# Patient Record
Sex: Female | Born: 1970 | Race: White | Hispanic: No | Smoking: Former smoker
Health system: Southern US, Community
[De-identification: ages and names within clinical notes are randomized; demographics above are authoritative.]

## PROBLEM LIST (undated history)

## (undated) DIAGNOSIS — F419 Anxiety disorder, unspecified: Secondary | ICD-10-CM

## (undated) DIAGNOSIS — E079 Disorder of thyroid, unspecified: Secondary | ICD-10-CM

## (undated) DIAGNOSIS — C19 Malignant neoplasm of rectosigmoid junction: Secondary | ICD-10-CM

## (undated) DIAGNOSIS — F32A Depression, unspecified: Secondary | ICD-10-CM

## (undated) DIAGNOSIS — D649 Anemia, unspecified: Secondary | ICD-10-CM

## (undated) DIAGNOSIS — T7840XA Allergy, unspecified, initial encounter: Secondary | ICD-10-CM

## (undated) DIAGNOSIS — K589 Irritable bowel syndrome without diarrhea: Secondary | ICD-10-CM

## (undated) DIAGNOSIS — M1990 Unspecified osteoarthritis, unspecified site: Secondary | ICD-10-CM

## (undated) DIAGNOSIS — M810 Age-related osteoporosis without current pathological fracture: Secondary | ICD-10-CM

## (undated) HISTORY — DX: Unspecified osteoarthritis, unspecified site: M19.90

## (undated) HISTORY — DX: Anxiety disorder, unspecified: F41.9

## (undated) HISTORY — DX: Depression, unspecified: F32.A

## (undated) HISTORY — DX: Allergy, unspecified, initial encounter: T78.40XA

## (undated) HISTORY — PX: CHOLECYSTECTOMY: SHX55

## (undated) HISTORY — PX: UPPER GASTROINTESTINAL ENDOSCOPY: SHX188

## (undated) HISTORY — PX: BOWEL RESECTION: SHX1257

## (undated) HISTORY — DX: Anemia, unspecified: D64.9

## (undated) HISTORY — PX: APPENDECTOMY: SHX54

## (undated) HISTORY — PX: ABDOMINAL HYSTERECTOMY: SHX81

## (undated) HISTORY — DX: Irritable bowel syndrome, unspecified: K58.9

## (undated) HISTORY — PX: CATARACT EXTRACTION: SUR2

## (undated) HISTORY — PX: COLON SURGERY: SHX602

## (undated) HISTORY — DX: Disorder of thyroid, unspecified: E07.9

## (undated) HISTORY — DX: Age-related osteoporosis without current pathological fracture: M81.0

---

## 2003-01-19 DIAGNOSIS — C19 Malignant neoplasm of rectosigmoid junction: Secondary | ICD-10-CM

## 2003-01-19 HISTORY — DX: Malignant neoplasm of rectosigmoid junction: C19

## 2011-05-10 ENCOUNTER — Emergency Department (INDEPENDENT_AMBULATORY_CARE_PROVIDER_SITE_OTHER): Payer: BC Managed Care – PPO

## 2011-05-10 ENCOUNTER — Emergency Department (HOSPITAL_BASED_OUTPATIENT_CLINIC_OR_DEPARTMENT_OTHER)
Admission: EM | Admit: 2011-05-10 | Discharge: 2011-05-10 | Disposition: A | Discharge status: Home / Self Care | Payer: BC Managed Care – PPO | Attending: Emergency Medicine | Admitting: Emergency Medicine

## 2011-05-10 ENCOUNTER — Encounter (HOSPITAL_BASED_OUTPATIENT_CLINIC_OR_DEPARTMENT_OTHER): Payer: Self-pay

## 2011-05-10 DIAGNOSIS — S8990XA Unspecified injury of unspecified lower leg, initial encounter: Secondary | ICD-10-CM

## 2011-05-10 DIAGNOSIS — IMO0002 Reserved for concepts with insufficient information to code with codable children: Secondary | ICD-10-CM | POA: Insufficient documentation

## 2011-05-10 DIAGNOSIS — M79609 Pain in unspecified limb: Secondary | ICD-10-CM

## 2011-05-10 DIAGNOSIS — X58XXXA Exposure to other specified factors, initial encounter: Secondary | ICD-10-CM

## 2011-05-10 DIAGNOSIS — S90129A Contusion of unspecified lesser toe(s) without damage to nail, initial encounter: Secondary | ICD-10-CM

## 2011-05-10 DIAGNOSIS — S9780XA Crushing injury of unspecified foot, initial encounter: Secondary | ICD-10-CM | POA: Insufficient documentation

## 2011-05-10 DIAGNOSIS — Y9229 Other specified public building as the place of occurrence of the external cause: Secondary | ICD-10-CM | POA: Insufficient documentation

## 2011-05-10 HISTORY — DX: Malignant neoplasm of rectosigmoid junction: C19

## 2011-05-10 MED ORDER — IBUPROFEN 800 MG PO TABS: 800.0000 mg | ORAL_TABLET | Freq: Three times a day (TID) | ORAL | Status: AC

## 2011-05-10 NOTE — ED Provider Notes (Signed)
Medical screening examination/treatment/procedure(s) were performed by non-physician practitioner and as supervising physician I was immediately available for consultation/collaboration.   Joya Gaskins, MD 05/10/11 1539

## 2011-05-10 NOTE — Discharge Instructions (Signed)
Return here as needed. Ice and elevate your foot and toes.

## 2011-05-10 NOTE — ED Provider Notes (Signed)
History     CSN: [id number]  Arrival date & time 05/10/11  1237   First MD Initiated Contact with Patient 05/10/11 1252      Chief Complaint  Patient presents with  . Foot Injury   HPI Patient presents emergency Dept. one week history of foot pain.  States that she was at a bar with a friend and her friend stepped on her toes with large shoes on.  States, that she has had pain since that time, and her third, fourth, and fifth toes.  Patient states that she is to use ice along with Motrin for pain.  States that the Motrin to help somewhat, but is still discomfort in her toes patient states that movement of her toes and palpation make the pain worse.  She states that she has not had any numbness or weakness of the foot or toes.    Past Medical History  Diagnosis Date  . Colorectal cancer 2005    Past Surgical History  Procedure Date  . Bowel resection   . Appendectomy   . Cholecystectomy   . Abdominal hysterectomy     History reviewed. No pertinent family history.  History  Substance Use Topics  . Smoking status: Current Everyday Smoker    Types: Cigarettes  . Smokeless tobacco: Never Used  . Alcohol Use: Yes     socially    OB History    Grav Para Term Preterm Abortions TAB SAB Ect Mult Living                  Review of Systems All pertinent positives and negatives reviewed in the history of present illness  Allergies  Codeine  Home Medications   Current Outpatient Rx  Name Route Sig Dispense Refill  . IBUPROFEN 800 MG PO TABS Oral Take 800 mg by mouth every 8 (eight) hours as needed.    . NYQUIL PO Oral Take by mouth at bedtime as needed.      BP 153/71  Pulse 91  Temp(Src) 98.5 F (36.9 C) (Oral)  Resp 16  Ht 5\' 9"  (1.753 m)  Wt 180 lb (81.647 kg)  BMI 26.58 kg/m2  SpO2 99%  Physical Exam Physical Examination: General appearance - alert, well appearing, and in no distress and oriented to person, place, and time Chest - clear to auscultation,  no wheezes, rales or rhonchi, symmetric air entry Heart - normal rate, regular rhythm, normal S1, S2, no murmurs, rubs, clicks or gallops Musculoskeletal - abnormal exam of right 3rd,4th and 5th toes, abnormal active range of motion ofR 3rd,4th and 5th toes.Patient has swelling to her 3rd 4th and 5th toes on the R foot and there is some mild bruising noted as well.   ED Course  Procedures (including critical care time)  Dg Foot Complete Right  05/10/2011  *RADIOLOGY REPORT*  Clinical Data: Dorsal foot pain after blunt trauma.  RIGHT FOOT COMPLETE - 3+ VIEW  Comparison: None.  Findings: No fracture, dislocation, or other significant abnormality.  IMPRESSION: Normal exam.  Original Report Authenticated By: Gwynn Burly, M.D.    Placed on a post op shoe and advised to elevate the foot. She is told to return here as needed. The x-rays were reviewed and there is no fracture.She is told to use ice as well.   MDM          Carlyle Dolly, PA-C 05/10/11 [phone]

## 2011-05-10 NOTE — ED Notes (Signed)
Pt states that her friend stepped on her R foot, pt states that she thinks she has 3 broken toes.  This happened on 4/13, pain continues.

## 2013-05-17 HISTORY — PX: ESOPHAGOGASTRODUODENOSCOPY: SHX1529

## 2013-06-20 DIAGNOSIS — M159 Polyosteoarthritis, unspecified: Secondary | ICD-10-CM | POA: Insufficient documentation

## 2013-06-20 DIAGNOSIS — F5101 Primary insomnia: Secondary | ICD-10-CM | POA: Insufficient documentation

## 2013-06-20 DIAGNOSIS — K219 Gastro-esophageal reflux disease without esophagitis: Secondary | ICD-10-CM | POA: Insufficient documentation

## 2013-06-20 DIAGNOSIS — M51369 Other intervertebral disc degeneration, lumbar region without mention of lumbar back pain or lower extremity pain: Secondary | ICD-10-CM | POA: Insufficient documentation

## 2013-06-20 DIAGNOSIS — H9190 Unspecified hearing loss, unspecified ear: Secondary | ICD-10-CM | POA: Insufficient documentation

## 2013-06-22 DIAGNOSIS — C189 Malignant neoplasm of colon, unspecified: Secondary | ICD-10-CM | POA: Insufficient documentation

## 2013-08-19 ENCOUNTER — Encounter (HOSPITAL_BASED_OUTPATIENT_CLINIC_OR_DEPARTMENT_OTHER): Payer: Self-pay | Admitting: Emergency Medicine

## 2013-08-19 ENCOUNTER — Emergency Department (HOSPITAL_BASED_OUTPATIENT_CLINIC_OR_DEPARTMENT_OTHER)
Admission: EM | Admit: 2013-08-19 | Discharge: 2013-08-19 | Disposition: A | Discharge status: Home / Self Care | Payer: BC Managed Care – PPO | Attending: Emergency Medicine | Admitting: Emergency Medicine

## 2013-08-19 ENCOUNTER — Emergency Department (HOSPITAL_BASED_OUTPATIENT_CLINIC_OR_DEPARTMENT_OTHER): Payer: BC Managed Care – PPO

## 2013-08-19 DIAGNOSIS — S99919A Unspecified injury of unspecified ankle, initial encounter: Secondary | ICD-10-CM

## 2013-08-19 DIAGNOSIS — Y9301 Activity, walking, marching and hiking: Secondary | ICD-10-CM | POA: Insufficient documentation

## 2013-08-19 DIAGNOSIS — S93409A Sprain of unspecified ligament of unspecified ankle, initial encounter: Secondary | ICD-10-CM | POA: Insufficient documentation

## 2013-08-19 DIAGNOSIS — Z85038 Personal history of other malignant neoplasm of large intestine: Secondary | ICD-10-CM | POA: Insufficient documentation

## 2013-08-19 DIAGNOSIS — Y929 Unspecified place or not applicable: Secondary | ICD-10-CM | POA: Insufficient documentation

## 2013-08-19 DIAGNOSIS — S93401A Sprain of unspecified ligament of right ankle, initial encounter: Secondary | ICD-10-CM

## 2013-08-19 DIAGNOSIS — S99929A Unspecified injury of unspecified foot, initial encounter: Secondary | ICD-10-CM

## 2013-08-19 DIAGNOSIS — Z87891 Personal history of nicotine dependence: Secondary | ICD-10-CM | POA: Insufficient documentation

## 2013-08-19 DIAGNOSIS — Z79899 Other long term (current) drug therapy: Secondary | ICD-10-CM | POA: Insufficient documentation

## 2013-08-19 DIAGNOSIS — S8990XA Unspecified injury of unspecified lower leg, initial encounter: Secondary | ICD-10-CM | POA: Insufficient documentation

## 2013-08-19 DIAGNOSIS — X500XXA Overexertion from strenuous movement or load, initial encounter: Secondary | ICD-10-CM | POA: Insufficient documentation

## 2013-08-19 MED ORDER — ACETAMINOPHEN 325 MG PO TABS
650.0000 mg | ORAL_TABLET | Freq: Once | ORAL | Status: AC
  Administered 2013-08-19: 650 mg via ORAL
  Filled 2013-08-19: qty 2

## 2013-08-19 NOTE — Discharge Instructions (Signed)
Ankle Sprain °An ankle sprain is an injury to the strong, fibrous tissues (ligaments) that hold the bones of your ankle joint together.  °CAUSES °An ankle sprain is usually caused by a fall or by twisting your ankle. Ankle sprains most commonly occur when you step on the outer edge of your foot, and your ankle turns inward. People who participate in sports are more prone to these types of injuries.  °SYMPTOMS  °· Pain in your ankle. The pain may be present at rest or only when you are trying to stand or walk. °· Swelling. °· Bruising. Bruising may develop immediately or within 1 to 2 days after your injury. °· Difficulty standing or walking, particularly when turning corners or changing directions. °DIAGNOSIS  °Your caregiver will ask you details about your injury and perform a physical exam of your ankle to determine if you have an ankle sprain. During the physical exam, your caregiver will press on and apply pressure to specific areas of your foot and ankle. Your caregiver will try to move your ankle in certain ways. An X-ray exam may be done to be sure a bone was not broken or a ligament did not separate from one of the bones in your ankle (avulsion fracture).  °TREATMENT  °Certain types of braces can help stabilize your ankle. Your caregiver can make a recommendation for this. Your caregiver may recommend the use of medicine for pain. If your sprain is severe, your caregiver may refer you to a surgeon who helps to restore function to parts of your skeletal system (orthopedist) or a physical therapist. °HOME CARE INSTRUCTIONS  °· Apply ice to your injury for 1-2 days or as directed by your caregiver. Applying ice helps to reduce inflammation and pain. °¨ Put ice in a plastic bag. °¨ Place a towel between your skin and the bag. °¨ Leave the ice on for 15-20 minutes at a time, every 2 hours while you are awake. °· Only take over-the-counter or prescription medicines for pain, discomfort, or fever as directed by  your caregiver. °· Elevate your injured ankle above the level of your heart as much as possible for 2-3 days. °· If your caregiver recommends crutches, use them as instructed. Gradually put weight on the affected ankle. Continue to use crutches or a cane until you can walk without feeling pain in your ankle. °· If you have a plaster splint, wear the splint as directed by your caregiver. Do not rest it on anything harder than a pillow for the first 24 hours. Do not put weight on it. Do not get it wet. You may take it off to take a shower or bath. °· You may have been given an elastic bandage to wear around your ankle to provide support. If the elastic bandage is too tight (you have numbness or tingling in your foot or your foot becomes cold and blue), adjust the bandage to make it comfortable. °· If you have an air splint, you may blow more air into it or let air out to make it more comfortable. You may take your splint off at night and before taking a shower or bath. Wiggle your toes in the splint several times per day to decrease swelling. °SEEK MEDICAL CARE IF:  °· You have rapidly increasing bruising or swelling. °· Your toes feel extremely cold or you lose feeling in your foot. °· Your pain is not relieved with medicine. °SEEK IMMEDIATE MEDICAL CARE IF: °· Your toes are numb or blue. °·   You have severe pain that is increasing. °MAKE SURE YOU:  °· Understand these instructions. °· Will watch your condition. °· Will get help right away if you are not doing well or get worse. °Document Released: 01/04/2005 Document Revised: 09/29/2011 Document Reviewed: 01/16/2011 °[employer]® Patient Information ©2015 [employer]. This information is not intended to replace advice given to you by your health care provider. Make sure you discuss any questions you have with your health care provider. ° ° °Ankle Exercises for Rehabilitation °Following ankle injuries, it is as important to follow your caregiver's instructions for  regaining full use of your ankle as it was to follow the initial treatment plan following the injury. The following are some suggestions for exercises and treatment, which can be done to help you regain full use of your ankle as soon as possible. °· Follow all instructions regarding physical therapy. °· Before exercising, it may be helpful to use heat on the muscles or joint being exercised. This loosens up the muscles and tendons (cordlike structure) and decreases chances of injury during your exercises. If this is not possible, just begin your exercises slowly to gradually warm up. °· Stand on your toes several times per day to strengthen the calf muscles. These are the muscles in the back of your leg between the knee and the heel. The cord you can feel just above the heel is the Achilles tendon. Rise up on your toes several times repeating this three to four times per day. Do not exercise to the point of pain. If pain starts to develop, decrease the exercise until you are comfortable again. °· Do range of motion exercises. This means moving the ankle in all directions. Practice writing the alphabet with your toes in the air. Do not increase beyond a range that is comfortable. °· Increase the strength of the muscles in the front of your leg by raising your toes and foot straight up in the air. Repeat this exercise as you did the calf exercise with the same warnings. This also help to stretch your muscles. °· Stretch your calf muscles also by leaning against a wall with your hands in front of you. Put your feet a few feet from the wall and bend your knees until you feel the muscles in your calves become tight. °· After exercising it may be helpful to put ice on the ankle to prevent swelling and improve rehabilitation. This may be done for 15 to 20 minutes following your exercises. If exercising is being done in the workplace, this may not always be possible. °· Taping an ankle injury may be helpful to give added  support following an injury. It also may help prevent reinjury. This may be true if you are in training or in a conditioning program. You and your caregiver can decide on the best course of action to follow. °Document Released: 01/02/2000 Document Revised: 05/21/2013 Document Reviewed: 12/30/2007 °[employer]Care® Patient Information ©2015 [employer]. This information is not intended to replace advice given to you by your health care provider. Make sure you discuss any questions you have with your health care provider. ° ° °

## 2013-08-19 NOTE — ED Notes (Signed)
Patient here with ongoing right ankle pain and swelling after twisting same while walking Friday night. Pain with any ambulation, positive distal pulses, mild swelling noted

## 2013-08-19 NOTE — ED Provider Notes (Signed)
CSN: [id number]     Arrival date & time 08/19/13  1139 History   First MD Initiated Contact with Patient 08/19/13 1413     Chief Complaint  Patient presents with  . Ankle Injury     (Consider location/radiation/quality/duration/timing/severity/associated sxs/prior Treatment) Patient is a 43 y.o. female presenting with ankle pain. The history is provided by the patient.  Ankle Pain Location:  Ankle Time since incident:  2 days Injury: yes   Mechanism of injury: fall   Fall:    Fall occurred: from standing onto the grass.   Height of fall:  Standing   Impact surface:  Grass   Point of impact:  Unable to specify   Entrapped after fall: no   Ankle location:  R ankle Pain details:    Quality:  Aching   Radiates to:  Does not radiate   Severity:  Mild   Onset quality:  Sudden   Duration:  2 days   Timing:  Constant   Progression:  Unchanged Chronicity:  New Dislocation: no   Foreign body present:  No foreign bodies Prior injury to area:  No Relieved by:  Nothing Worsened by:  Bearing weight Ineffective treatments:  None tried Associated symptoms: no back pain, no fatigue, no fever and no neck pain     Past Medical History  Diagnosis Date  . Colorectal cancer 2005   Past Surgical History  Procedure Laterality Date  . Bowel resection    . Appendectomy    . Cholecystectomy    . Abdominal hysterectomy     No family history on file. History  Substance Use Topics  . Smoking status: Former Smoker    Types: Cigarettes  . Smokeless tobacco: Never Used  . Alcohol Use: Yes     Comment: socially   OB History   Grav Para Term Preterm Abortions TAB SAB Ect Mult Living                 Review of Systems  Constitutional: Negative for fever and fatigue.  HENT: Negative for congestion and drooling.   Eyes: Negative for pain.  Respiratory: Negative for cough and shortness of breath.   Cardiovascular: Negative for chest pain.  Gastrointestinal: Negative for nausea,  vomiting, abdominal pain and diarrhea.  Genitourinary: Negative for dysuria and hematuria.  Musculoskeletal: Negative for back pain, gait problem and neck pain.  Skin: Negative for color change.  Neurological: Negative for dizziness and headaches.  Hematological: Negative for adenopathy.  Psychiatric/Behavioral: Negative for behavioral problems.  All other systems reviewed and are negative.     Allergies  Codeine  Home Medications   Prior to Admission medications   Medication Sig Start Date End Date Taking? Authorizing Provider  levothyroxine (SYNTHROID, LEVOTHROID) 112 MCG tablet Take 112 mcg by mouth daily before breakfast.   Yes Historical Provider, MD  LORazepam (ATIVAN) 1 MG tablet Take 1 mg by mouth every 8 (eight) hours.   Yes Historical Provider, MD  ibuprofen (ADVIL,MOTRIN) 800 MG tablet Take 800 mg by mouth every 8 (eight) hours as needed.    Historical Provider, MD  Pseudoeph-Doxylamine-DM-APAP (NYQUIL PO) Take by mouth at bedtime as needed.    Historical Provider, MD   BP 111/80  Pulse 77  Temp(Src) 98.6 F (37 C) (Oral)  Resp 16  Ht 5\' 8"  (1.727 m)  Wt 185 lb (83.915 kg)  BMI 28.14 kg/m2  SpO2 99% Physical Exam  Nursing note and vitals reviewed. Constitutional: She is oriented to person, place, and  time. She appears well-developed and well-nourished.  HENT:  Head: Normocephalic and atraumatic.  Mouth/Throat: Oropharynx is clear and moist. No oropharyngeal exudate.  Eyes: Conjunctivae and EOM are normal. Pupils are equal, round, and reactive to light.  Neck: Normal range of motion. Neck supple.  Cardiovascular: Normal rate, regular rhythm, normal heart sounds and intact distal pulses.  Exam reveals no gallop and no friction rub.   No murmur heard. Pulmonary/Chest: Effort normal and breath sounds normal. No respiratory distress. She has no wheezes.  Abdominal: Soft. Bowel sounds are normal. There is no tenderness. There is no rebound and no guarding.   Musculoskeletal: Normal range of motion. She exhibits no edema and no tenderness.  2+ distal pulses in LE's.   Sensation intact in RLE.   Mildly limited rom of the right ankle d/t pain.   Focal ttp of dorsum of right foot. No gross abnormality noted.   Neurological: She is alert and oriented to person, place, and time.  Skin: Skin is warm and dry.  Psychiatric: She has a normal mood and affect. Her behavior is normal.    ED Course  Procedures (including critical care time) Labs Review Labs Reviewed - No data to display  Imaging Review Dg Ankle Complete Right  08/19/2013   CLINICAL DATA:  Injury  EXAM: RIGHT ANKLE - COMPLETE 3+ VIEW  COMPARISON:  None.  FINDINGS: There is no evidence of fracture, dislocation, or joint effusion. There is no evidence of arthropathy or other focal bone abnormality. Soft tissues are unremarkable.  IMPRESSION: Negative.   Electronically Signed   By: Maryclare Bean M.D.   On: 08/19/2013 13:07     EKG Interpretation None      MDM   Final diagnoses:  Ankle sprain, right, initial encounter    2:16 PM 43 y.o. female pw mechanical fall which occurred 2 days ago when running off her porch. She c/o right foot/ankle pain. I interpreted/reviewed the labs and/or imaging which were non-contributory.  Likely sprain. Rec RICE. Pt would like fx boot for stability. Will provide crutches as well.    3:47 PM:  I have discussed the diagnosis/risks/treatment options with the patient and believe the pt to be eligible for discharge home to follow-up with sports med if no better in 5-6 days. We also discussed returning to the ED immediately if new or worsening sx occur. We discussed the sx which are most concerning (e.g., worsening pain) that necessitate immediate return. Medications administered to the patient during their visit and any new prescriptions provided to the patient are listed below.  Medications given during this visit Medications  acetaminophen (TYLENOL) tablet  650 mg (650 mg Oral Given 08/19/13 1501)    New Prescriptions   No medications on file     Blanchard Kelch, MD 08/20/13 [phone]

## 2013-09-05 ENCOUNTER — Encounter: Payer: Self-pay | Admitting: Family Medicine

## 2013-09-05 ENCOUNTER — Ambulatory Visit (INDEPENDENT_AMBULATORY_CARE_PROVIDER_SITE_OTHER): Payer: BC Managed Care – PPO | Admitting: Family Medicine

## 2013-09-05 VITALS — BP 127/84 | HR 84 | Ht 69.0 in | Wt 204.0 lb

## 2013-09-05 DIAGNOSIS — S99929A Unspecified injury of unspecified foot, initial encounter: Secondary | ICD-10-CM

## 2013-09-05 DIAGNOSIS — S8990XA Unspecified injury of unspecified lower leg, initial encounter: Secondary | ICD-10-CM

## 2013-09-05 DIAGNOSIS — S99919A Unspecified injury of unspecified ankle, initial encounter: Secondary | ICD-10-CM

## 2013-09-05 DIAGNOSIS — S99911A Unspecified injury of right ankle, initial encounter: Secondary | ICD-10-CM

## 2013-09-05 MED ORDER — HYDROCODONE-ACETAMINOPHEN 5-325 MG PO TABS: 1.0000 | ORAL_TABLET | Freq: Four times a day (QID) | ORAL | Status: DC | PRN

## 2013-09-05 NOTE — Patient Instructions (Signed)
You have a high ankle sprain. These typically take 6-8 weeks to recover. Ice the area for 15 minutes at a time, 3-4 times a day Aleve 2 tabs twice a day with food OR ibuprofen 3 tabs three times a day with food for pain and inflammation. Norco as needed for severe pain. Elevate above the level of your heart when possible Crutches if needed to help with walking Bear weight when tolerated Use laceup ankle brace to help with stability while you recover from this injury. Ok to use the boot if necessary for long walks. Come out of the boot/brace twice a day to do Up/down and alphabet exercises 2-3 sets of each. Consider physical therapy for strengthening and balance exercises in the future. If not improving as expected, we may repeat x-rays or consider further testing like an MRI. Follow up with me in 2 weeks - hopefully we can add therapy and/or strengthening exercises at that time.

## 2013-09-06 ENCOUNTER — Encounter: Payer: Self-pay | Admitting: Family Medicine

## 2013-09-07 ENCOUNTER — Encounter: Payer: Self-pay | Admitting: Family Medicine

## 2013-09-07 DIAGNOSIS — S99911A Unspecified injury of right ankle, initial encounter: Secondary | ICD-10-CM | POA: Insufficient documentation

## 2013-09-07 NOTE — Assessment & Plan Note (Signed)
Right ankle sprain - consistent with high ankle sprain.  Radiographs and today's brief MSK u/s negative for fracture of malleoli or 5th metatarsal.  Icing, nsaids with norco as needed.  Switch to ASO if tolerated and do HEP that was reviewed today.  Consider PT in future.  F/u in 2 weeks for reevaluation.

## 2013-09-07 NOTE — Progress Notes (Signed)
Patient ID: Michelle Potts, female   DOB: Jun 02, 1970, 43 y.o.   MRN: [id number]  PCP: Patricia Nettle, MD  Subjective:   HPI: Patient is a 43 y.o. female here for right ankle injury.  Patient reports she was going down steps on July 31 when she stepped into a hole and rolled the ankle. Inversion type of injury. Couldn't bear weight right after this. Still with 7/10 level of pain. Difficulty sleeping. Most pain anterolateral ankle. Taking advil. Using cam walker as well. Radiographs negative for fracture.  Past Medical History  Diagnosis Date  . Colorectal cancer 2005    Current Outpatient Prescriptions on File Prior to Visit  Medication Sig Dispense Refill  . levothyroxine (SYNTHROID, LEVOTHROID) 112 MCG tablet Take 112 mcg by mouth daily before breakfast.      . LORazepam (ATIVAN) 1 MG tablet Take 1 mg by mouth every 8 (eight) hours.       No current facility-administered medications on file prior to visit.    Past Surgical History  Procedure Laterality Date  . Bowel resection    . Appendectomy    . Cholecystectomy    . Abdominal hysterectomy      Allergies  Allergen Reactions  . Codeine Nausea And Vomiting    History   Social History  . Marital Status: Married    Spouse Name: N/A    Number of Children: N/A  . Years of Education: N/A   Occupational History  . Not on file.   Social History Main Topics  . Smoking status: Former Smoker    Types: Cigarettes  . Smokeless tobacco: Never Used  . Alcohol Use: Yes     Comment: socially  . Drug Use: No  . Sexual Activity: Not on file   Other Topics Concern  . Not on file   Social History Narrative  . No narrative on file    No family history on file.  BP 127/84  Pulse 84  Ht 5\' 9"  (1.753 m)  Wt 204 lb (92.534 kg)  BMI 30.11 kg/m2  Review of Systems: See HPI above.    Objective:  Physical Exam:  Gen: NAD  Right ankle/foot: Mild- mod swelling antero lateral ankle.  No bruising, other  deformity. Mild limitation ROM all directions. TTP anterior ankle joint, less over ATFL.  Mild tenderrness both malleoli, minimal base 5th. No other foot/ankle tenderness. Painful ant drawer and talar tilt, guarding. Positive syndesmotic compression. Thompsons test negative. NV intact distally.    Assessment & Plan:  1. Right ankle sprain - consistent with high ankle sprain.  Radiographs and today's brief MSK u/s negative for fracture of malleoli or 5th metatarsal.  Icing, nsaids with norco as needed.  Switch to ASO if tolerated and do HEP that was reviewed today.  Consider PT in future.  F/u in 2 weeks for reevaluation.

## 2013-09-10 ENCOUNTER — Telehealth: Payer: Self-pay | Admitting: Family Medicine

## 2013-09-10 NOTE — Telephone Encounter (Signed)
She could try tramadol instead though it may cause same side effects (it's not as strong as the hydrocodone).  Other option is taking an anti-nausea medicine like phenergan with the hydrocodone.  Let me know if she would like to do either of these.

## 2013-09-11 DIAGNOSIS — E039 Hypothyroidism, unspecified: Secondary | ICD-10-CM | POA: Insufficient documentation

## 2013-09-19 ENCOUNTER — Encounter: Payer: Self-pay | Admitting: Family Medicine

## 2013-09-19 ENCOUNTER — Ambulatory Visit (HOSPITAL_BASED_OUTPATIENT_CLINIC_OR_DEPARTMENT_OTHER)
Admission: RE | Admit: 2013-09-19 | Discharge: 2013-09-19 | Disposition: A | Discharge status: Home / Self Care | Payer: BC Managed Care – PPO | Source: Ambulatory Visit | Attending: Family Medicine | Admitting: Family Medicine

## 2013-09-19 ENCOUNTER — Ambulatory Visit (INDEPENDENT_AMBULATORY_CARE_PROVIDER_SITE_OTHER): Payer: BC Managed Care – PPO | Admitting: Family Medicine

## 2013-09-19 VITALS — BP 127/88 | HR 87 | Ht 69.0 in | Wt 204.0 lb

## 2013-09-19 DIAGNOSIS — S99919A Unspecified injury of unspecified ankle, initial encounter: Secondary | ICD-10-CM

## 2013-09-19 DIAGNOSIS — S8990XA Unspecified injury of unspecified lower leg, initial encounter: Secondary | ICD-10-CM | POA: Diagnosis not present

## 2013-09-19 DIAGNOSIS — S99911D Unspecified injury of right ankle, subsequent encounter: Secondary | ICD-10-CM

## 2013-09-19 DIAGNOSIS — S99929A Unspecified injury of unspecified foot, initial encounter: Secondary | ICD-10-CM

## 2013-09-19 DIAGNOSIS — X58XXXA Exposure to other specified factors, initial encounter: Secondary | ICD-10-CM | POA: Diagnosis not present

## 2013-09-19 DIAGNOSIS — Z5189 Encounter for other specified aftercare: Secondary | ICD-10-CM | POA: Diagnosis present

## 2013-09-19 NOTE — Addendum Note (Signed)
Addended by: Sherrie George F on: 09/19/2013 02:10 PM   Modules accepted: Orders

## 2013-09-19 NOTE — Assessment & Plan Note (Signed)
Repeat radiographs negative today.  Consistent with high ankle sprain. Icing, nsaids with norco as needed (this made her sick and was considering to try tramadol instead).  Over a month out from injury with minimal improvement though no evidence CRPS, tendon rupture.  OCD is possible but unlikely.  Will continue ASO, nsaids.  Add physical therapy.  F/u in 4 weeks.  MRI if not improving.

## 2013-09-19 NOTE — Progress Notes (Signed)
Patient ID: Michelle Potts, female   DOB: 31-Oct-1970, 43 y.o.   MRN: [id number]  PCP: Patricia Nettle, MD  Subjective:   HPI: Patient is a 43 y.o. female here for right ankle injury.  8/19: Patient reports she was going down steps on July 31 when she stepped into a hole and rolled the ankle. Inversion type of injury. Couldn't bear weight right after this. Still with 7/10 level of pain. Difficulty sleeping. Most pain anterolateral ankle. Taking advil. Using cam walker as well. Radiographs negative for fracture.  9/2: Patient reports still having 7/10 level of pain in ankle. Maybe slightly better than last visit. Using one crutch with ASO to help ambulate. Given hydrocodone from ED. Some swelling. Takes advil as well. Pain radiates up lower leg.  Past Medical History  Diagnosis Date  . Colorectal cancer 2005    Current Outpatient Prescriptions on File Prior to Visit  Medication Sig Dispense Refill  . dicyclomine (BENTYL) 20 MG tablet       . levothyroxine (SYNTHROID, LEVOTHROID) 112 MCG tablet Take 112 mcg by mouth daily before breakfast.      . liothyronine (CYTOMEL) 25 MCG tablet       . LORazepam (ATIVAN) 1 MG tablet Take 1 mg by mouth every 8 (eight) hours.       No current facility-administered medications on file prior to visit.    Past Surgical History  Procedure Laterality Date  . Bowel resection    . Appendectomy    . Cholecystectomy    . Abdominal hysterectomy      Allergies  Allergen Reactions  . Codeine Nausea And Vomiting    History   Social History  . Marital Status: Married    Spouse Name: N/A    Number of Children: N/A  . Years of Education: N/A   Occupational History  . Not on file.   Social History Main Topics  . Smoking status: Former Smoker    Types: Cigarettes  . Smokeless tobacco: Never Used  . Alcohol Use: Yes     Comment: socially  . Drug Use: No  . Sexual Activity: Not on file   Other Topics Concern  . Not on file   Social  History Narrative  . No narrative on file    No family history on file.  BP 127/88  Pulse 87  Ht 5\' 9"  (1.753 m)  Wt 204 lb (92.534 kg)  BMI 30.11 kg/m2  Review of Systems: See HPI above.    Objective:  Physical Exam:  Gen: NAD  Right ankle/foot: Minimal swelling antero lateral ankle, improved.  No bruising, other deformity. Mild limitation ROM all directions. TTP anterior ankle joint, less over ATFL.  Mild tenderrness both malleoli, minimal base 5th. No other foot/ankle tenderness. Painful ant drawer and talar tilt, guarding. Positive syndesmotic compression. Thompsons test negative. NV intact distally.    Assessment & Plan:  1. Right ankle injury - Repeat radiographs negative today.  Consistent with high ankle sprain. Icing, nsaids with norco as needed (this made her sick and was considering to try tramadol instead).  Over a month out from injury with minimal improvement though no evidence CRPS, tendon rupture.  OCD is possible but unlikely.  Will continue ASO, nsaids.  Add physical therapy.  F/u in 4 weeks.  MRI if not improving.

## 2013-09-19 NOTE — Patient Instructions (Signed)
You have a high ankle sprain. Start physical therapy for strengthening and balance exercises, modalities. These typically take 6-8 weeks to recover. Ice the area for 15 minutes at a time, 3-4 times a day Aleve 2 tabs twice a day with food OR ibuprofen 3 tabs three times a day with food for pain and inflammation. Tramadol as needed for severe pain. Elevate above the level of your heart when possible Crutches if needed to help with walking Bear weight when tolerated Use laceup ankle brace to help with stability while you recover from this injury. Ok to use the boot if necessary for long walks. Come out of the boot/brace twice a day to do Up/down and alphabet exercises 2-3 sets of each. If not improving as expected, we will consider further testing like an MRI. Follow up with me in 4 weeks. Take vitamin C 500mg  daily.

## 2013-10-24 ENCOUNTER — Ambulatory Visit: Payer: BC Managed Care – PPO | Admitting: Family Medicine

## 2013-11-20 DIAGNOSIS — E782 Mixed hyperlipidemia: Secondary | ICD-10-CM | POA: Insufficient documentation

## 2014-06-24 HISTORY — PX: COLONOSCOPY: SHX174

## 2014-07-03 DIAGNOSIS — L732 Hidradenitis suppurativa: Secondary | ICD-10-CM | POA: Insufficient documentation

## 2015-07-30 DIAGNOSIS — J321 Chronic frontal sinusitis: Secondary | ICD-10-CM | POA: Insufficient documentation

## 2015-07-30 DIAGNOSIS — J301 Allergic rhinitis due to pollen: Secondary | ICD-10-CM | POA: Insufficient documentation

## 2015-07-30 DIAGNOSIS — F4322 Adjustment disorder with anxiety: Secondary | ICD-10-CM | POA: Insufficient documentation

## 2015-11-10 DIAGNOSIS — L659 Nonscarring hair loss, unspecified: Secondary | ICD-10-CM | POA: Insufficient documentation

## 2016-04-14 DIAGNOSIS — F419 Anxiety disorder, unspecified: Secondary | ICD-10-CM | POA: Insufficient documentation

## 2016-08-16 ENCOUNTER — Other Ambulatory Visit: Payer: Self-pay | Admitting: Obstetrics and Gynecology

## 2016-08-16 DIAGNOSIS — N6453 Retraction of nipple: Secondary | ICD-10-CM

## 2016-08-17 ENCOUNTER — Ambulatory Visit
Admission: RE | Admit: 2016-08-17 | Discharge: 2016-08-17 | Disposition: A | Discharge status: Home / Self Care | Payer: 59 | Source: Ambulatory Visit | Attending: Obstetrics and Gynecology | Admitting: Obstetrics and Gynecology

## 2016-08-17 DIAGNOSIS — N6453 Retraction of nipple: Secondary | ICD-10-CM

## 2016-08-17 MED ORDER — GADOBENATE DIMEGLUMINE 529 MG/ML IV SOLN
15.0000 mL | Freq: Once | INTRAVENOUS | Status: AC | PRN
  Administered 2016-08-17: 15 mL via INTRAVENOUS

## 2016-09-09 DIAGNOSIS — E559 Vitamin D deficiency, unspecified: Secondary | ICD-10-CM | POA: Insufficient documentation

## 2017-10-31 DIAGNOSIS — E538 Deficiency of other specified B group vitamins: Secondary | ICD-10-CM | POA: Insufficient documentation

## 2018-08-07 DIAGNOSIS — K582 Mixed irritable bowel syndrome: Secondary | ICD-10-CM | POA: Insufficient documentation

## 2020-03-04 ENCOUNTER — Other Ambulatory Visit: Payer: Self-pay

## 2020-03-04 ENCOUNTER — Encounter: Payer: Self-pay | Admitting: Family Medicine

## 2020-03-04 ENCOUNTER — Ambulatory Visit: Payer: Managed Care, Other (non HMO) | Admitting: Family Medicine

## 2020-03-04 VITALS — BP 140/84 | HR 80 | Temp 98.9°F | Ht 69.0 in | Wt 148.5 lb

## 2020-03-04 DIAGNOSIS — R109 Unspecified abdominal pain: Secondary | ICD-10-CM

## 2020-03-04 DIAGNOSIS — Z85038 Personal history of other malignant neoplasm of large intestine: Secondary | ICD-10-CM | POA: Diagnosis not present

## 2020-03-04 DIAGNOSIS — F418 Other specified anxiety disorders: Secondary | ICD-10-CM

## 2020-03-04 DIAGNOSIS — R0789 Other chest pain: Secondary | ICD-10-CM

## 2020-03-04 MED ORDER — HYDROXYZINE HCL 25 MG PO TABS: 25.0000 mg | ORAL_TABLET | Freq: Three times a day (TID) | ORAL | 2 refills | Status: DC | PRN

## 2020-03-04 MED ORDER — AMITRIPTYLINE HCL 25 MG PO TABS: 25.0000 mg | ORAL_TABLET | Freq: Every day | ORAL | 2 refills | Status: DC

## 2020-03-04 MED ORDER — MELOXICAM 15 MG PO TABS: 15.0000 mg | ORAL_TABLET | Freq: Every day | ORAL | 0 refills | Status: DC

## 2020-03-04 MED ORDER — DICYCLOMINE HCL 10 MG PO CAPS: ORAL_CAPSULE | ORAL | 0 refills | Status: DC

## 2020-03-04 NOTE — Progress Notes (Signed)
Chief Complaint  Patient presents with  . New Patient (Initial Visit)    Rib pain Referral GI Stress Congestion        New Patient Visit SUBJECTIVE: HPI: Michelle Potts is an 50 y.o.female who is being seen for establishing care.  The patient was previously seen at [facility].  Cramping, bowel changes, abd pain. Ove rpast several mo got quite a bit worse. She has been losing weight- 30 lbs over past 1.5 years, 7-10 lbs since her husbands dx of lung cancer stage IV. Feels like gnawing and butterflies at the same time. No tarry stools or bleeding. Had a colonoscopy 06/2018, reported as normal. She was told she was to f/u in 5 years. Stress makes her s/s's worse.   R sided rib pain over past few (she thinks 3) days. She was also cleaning out her closet. She was also dancing. Has been using heat and Tylenol.   Husband dx'd w stage IV lung cancer and she has been having lots of issues sleeping because of racing thoughts and with stress. She is not taking anything routinely. She does not have a counselor/psychologist.   Past Medical History:  Diagnosis Date  . Colorectal cancer ([place]) 2005   Past Surgical History:  Procedure Laterality Date  . ABDOMINAL HYSTERECTOMY    . APPENDECTOMY    . BOWEL RESECTION    . CHOLECYSTECTOMY     History reviewed. No pertinent family history. Allergies  Allergen Reactions  . Codeine Nausea And Vomiting    Current Outpatient Medications:  .  amitriptyline (ELAVIL) 25 MG tablet, Take 1 tablet (25 mg total) by mouth at bedtime., Disp: 30 tablet, Rfl: 2 .  dicyclomine (BENTYL) 10 MG capsule, Take 1 tab every 6 hours as needed for abdominal cramping., Disp: 60 capsule, Rfl: 0 .  hydrOXYzine (ATARAX/VISTARIL) 25 MG tablet, Take 1-3 tablets (25-75 mg total) by mouth every 8 (eight) hours as needed for anxiety., Disp: 60 tablet, Rfl: 2 .  meloxicam (MOBIC) 15 MG tablet, Take 1 tablet (15 mg total) by mouth daily., Disp: 30 tablet, Rfl: 0  OBJECTIVE: BP 140/84 (BP  Location: Right Arm, Patient Position: Sitting, Cuff Size: Normal)   Pulse 80   Temp 98.9 F (37.2 C) (Oral)   Ht 5\' 9"  (1.753 m)   Wt 148 lb 8 oz (67.4 kg)   SpO2 99%   BMI 21.93 kg/m  General:  well developed, well nourished, in no apparent distress Skin:  no significant moles, warts, or growths Abd: BS+, S, ND, diffuse ttp, neg Rovsing's, Carnett's, Murphy's, McBurney's Throat/Pharynx:  lips and gingiva without lesion; tongue and uvula midline; non-inflamed pharynx; no exudates or postnasal drainage Lungs:  clear to auscultation, breath sounds equal bilaterally, no respiratory distress Cardio:  regular rate and rhythm, no LE edema or bruits Musculoskeletal: +ttp over R posterior ax line of chest wall around R 6-8. No crepitus. No erythema or edema noted.  Psych: well oriented with normal range of affect and appropriate judgment/insight  ASSESSMENT/PLAN: History of colon cancer - Plan: Ambulatory referral to Gastroenterology  Abdominal cramping - Plan: amitriptyline (ELAVIL) 25 MG tablet, Ambulatory referral to Gastroenterology, dicyclomine (BENTYL) 10 MG capsule  Right-sided chest wall pain - Plan: meloxicam (MOBIC) 15 MG tablet  Situational anxiety - Plan: amitriptyline (ELAVIL) 25 MG tablet, hydrOXYzine (ATARAX/VISTARIL) 25 MG tablet  1/2. Pt would prefer to transfer care from [facility] to [facility]. Will place referral. I think she has IBS components to her issue today. Will initiate Elavil  25 mg qhs. Bentyl prn.  3. Mobic. Ice. Heat. Stretching. 4. Elavil as above. [facility] info provided. Anxiety coping techniques discussed and written down. Atarax 25-75 mg q 8 hrs prn. States she is stressed and that is why her BP is up today. Patient should return in 1 mo to reck. The patient voiced understanding and agreement to the plan.   [place], DO 03/04/20  9:27 AM

## 2020-03-04 NOTE — Patient Instructions (Signed)
If you do not hear anything about your referral in the next 1-2 weeks, call our office and ask for an update.  Please consider counseling. Contact [phone] to schedule an appointment or inquire about cost/insurance coverage.  Aim to do some physical exertion for 150 minutes per week. This is typically divided into 5 days per week, 30 minutes per day. The activity should be enough to get your heart rate up. Anything is better than nothing if you have time constraints.  Coping skills Choose 5 that work for you:  Take a deep breath  Count to 20  Read a book  Do a puzzle  Meditate  Bake  Sing  Knit  Garden   Pray  Go outside  Call a friend  Listen to music  Take a walk  Color  Send a note   Take a bath  Watch a movie  Be alone in a quiet place  Pet an animal  Visit a friend  Journal  Exercise  Stretch   

## 2020-03-06 ENCOUNTER — Encounter: Payer: Self-pay | Admitting: Gastroenterology

## 2020-03-14 ENCOUNTER — Other Ambulatory Visit: Payer: Self-pay | Admitting: Family Medicine

## 2020-03-14 DIAGNOSIS — R0789 Other chest pain: Secondary | ICD-10-CM

## 2020-03-14 NOTE — Progress Notes (Signed)
refe

## 2020-03-19 ENCOUNTER — Ambulatory Visit (HOSPITAL_BASED_OUTPATIENT_CLINIC_OR_DEPARTMENT_OTHER)
Admission: RE | Admit: 2020-03-19 | Discharge: 2020-03-19 | Disposition: A | Discharge status: Home / Self Care | Payer: Managed Care, Other (non HMO) | Source: Ambulatory Visit | Attending: Family Medicine | Admitting: Family Medicine

## 2020-03-19 ENCOUNTER — Other Ambulatory Visit (INDEPENDENT_AMBULATORY_CARE_PROVIDER_SITE_OTHER): Payer: Managed Care, Other (non HMO)

## 2020-03-19 ENCOUNTER — Ambulatory Visit (INDEPENDENT_AMBULATORY_CARE_PROVIDER_SITE_OTHER): Payer: Managed Care, Other (non HMO) | Admitting: Family Medicine

## 2020-03-19 ENCOUNTER — Encounter: Payer: Self-pay | Admitting: Family Medicine

## 2020-03-19 ENCOUNTER — Encounter: Payer: Self-pay | Admitting: Gastroenterology

## 2020-03-19 ENCOUNTER — Ambulatory Visit: Payer: Managed Care, Other (non HMO) | Admitting: Gastroenterology

## 2020-03-19 ENCOUNTER — Other Ambulatory Visit: Payer: Self-pay

## 2020-03-19 VITALS — BP 114/80 | HR 79 | Temp 97.6°F | Ht 69.0 in | Wt 147.0 lb

## 2020-03-19 VITALS — BP 112/76 | HR 76 | Ht 69.0 in | Wt 174.1 lb

## 2020-03-19 DIAGNOSIS — R1011 Right upper quadrant pain: Secondary | ICD-10-CM

## 2020-03-19 DIAGNOSIS — R0789 Other chest pain: Secondary | ICD-10-CM | POA: Diagnosis not present

## 2020-03-19 DIAGNOSIS — K582 Mixed irritable bowel syndrome: Secondary | ICD-10-CM

## 2020-03-19 DIAGNOSIS — Z85038 Personal history of other malignant neoplasm of large intestine: Secondary | ICD-10-CM | POA: Diagnosis not present

## 2020-03-19 DIAGNOSIS — K2900 Acute gastritis without bleeding: Secondary | ICD-10-CM | POA: Diagnosis not present

## 2020-03-19 DIAGNOSIS — L309 Dermatitis, unspecified: Secondary | ICD-10-CM | POA: Diagnosis not present

## 2020-03-19 DIAGNOSIS — F418 Other specified anxiety disorders: Secondary | ICD-10-CM

## 2020-03-19 LAB — COMPREHENSIVE METABOLIC PANEL
ALT: 8 U/L (ref 0–35)
AST: 10 U/L (ref 0–37)
Albumin: 3.9 g/dL (ref 3.5–5.2)
Alkaline Phosphatase: 61 U/L (ref 39–117)
BUN: 13 mg/dL (ref 6–23)
CO2: 35 mEq/L — ABNORMAL HIGH (ref 19–32)
Calcium: 9.5 mg/dL (ref 8.4–10.5)
Chloride: 98 mEq/L (ref 96–112)
Creatinine, Ser: 0.75 mg/dL (ref 0.40–1.20)
GFR: 92.48 mL/min (ref >60.00)
Glucose, Bld: 78 mg/dL (ref 70–99)
Potassium: 3.9 mEq/L (ref 3.5–5.1)
Sodium: 138 mEq/L (ref 135–145)
Total Bilirubin: 0.2 mg/dL (ref 0.2–1.2)
Total Protein: 7 g/dL (ref 6.0–8.3)

## 2020-03-19 LAB — CBC WITH DIFFERENTIAL/PLATELET
Basophils Absolute: 0 10*3/uL (ref 0.0–0.1)
Basophils Relative: 0.6 % (ref 0.0–3.0)
Eosinophils Absolute: 0.3 10*3/uL (ref 0.0–0.7)
Eosinophils Relative: 5.9 % — ABNORMAL HIGH (ref 0.0–5.0)
HCT: 34.5 % — ABNORMAL LOW (ref 36.0–46.0)
Hemoglobin: 11.5 g/dL — ABNORMAL LOW (ref 12.0–15.0)
Lymphocytes Relative: 32 % (ref 12.0–46.0)
Lymphs Abs: 1.7 10*3/uL (ref 0.7–4.0)
MCHC: 33.2 g/dL (ref 30.0–36.0)
MCV: 91.6 fl (ref 78.0–100.0)
Monocytes Absolute: 0.4 10*3/uL (ref 0.1–1.0)
Monocytes Relative: 6.9 % (ref 3.0–12.0)
Neutro Abs: 2.8 10*3/uL (ref 1.4–7.7)
Neutrophils Relative %: 54.6 % (ref 43.0–77.0)
Platelets: 210 10*3/uL (ref 150.0–400.0)
RBC: 3.77 Mil/uL — ABNORMAL LOW (ref 3.87–5.11)
RDW: 13.3 % (ref 11.5–15.5)
WBC: 5.2 10*3/uL (ref 4.0–10.5)

## 2020-03-19 LAB — HIGH SENSITIVITY CRP: CRP, High Sensitivity: 1.92 mg/L (ref 0.000–5.000)

## 2020-03-19 LAB — LIPASE: Lipase: 33 U/L (ref 11.0–59.0)

## 2020-03-19 MED ORDER — TRIAMCINOLONE ACETONIDE 0.5 % EX OINT: 1.0000 "application " | TOPICAL_OINTMENT | Freq: Two times a day (BID) | CUTANEOUS | 0 refills | Status: DC

## 2020-03-19 MED ORDER — ONDANSETRON 4 MG PO TBDP: 4.0000 mg | ORAL_TABLET | Freq: Three times a day (TID) | ORAL | 0 refills | Status: DC | PRN

## 2020-03-19 MED ORDER — LORAZEPAM 0.5 MG PO TABS: 0.5000 mg | ORAL_TABLET | Freq: Two times a day (BID) | ORAL | 1 refills | Status: DC | PRN

## 2020-03-19 NOTE — Patient Instructions (Signed)
If you are age 51 or older, your body mass index should be between 23-30. Your Body mass index is 25.71 kg/m. If this is out of the aforementioned range listed, please consider follow up with your Primary Care Provider.  If you are age 26 or younger, your body mass index should be between 19-25. Your Body mass index is 25.71 kg/m. If this is out of the aformentioned range listed, please consider follow up with your Primary Care Provider.   Please go to the lab on the 2nd floor [address] before you leave the office today.   You have been [facility] CT [facility] [place] [address] [address] [facility]).   You are scheduled on 03/26/2020 at 1130am . You should arrive 15 minutes prior to your appointment time for registration. Please follow the written instructions below on the day of your exam:  WARNING: IF YOU ARE ALLERGIC TO IODINE/X-RAY DYE, PLEASE NOTIFY RADIOLOGY IMMEDIATELY AT [phone]! YOU WILL BE GIVEN A 13 HOUR PREMEDICATION PREP.  1) Do not eat or drink anything after 730am  (4 hours prior to your test) 2) You have been given 2 bottles of oral contrast to drink. The solution may taste better if refrigerated, but do NOT add ice or any other liquid to this solution. Shake well before drinking.    Drink 1 bottle of contrast @ 930am (2 hours prior to your exam)  Drink 1 bottle of contrast @ 1030am (1 hour prior to your exam)  You may take any medications as prescribed with a small amount of water, if necessary. If you take any of the following medications: METFORMIN, GLUCOPHAGE, GLUCOVANCE, AVANDAMET, RIOMET, FORTAMET, JAARS MET, JANUMET, GLUMETZA or METAGLIP, you MAY be asked to HOLD this medication 48 hours AFTER the exam.  The purpose of you drinking the oral contrast is to aid in the visualization of your intestinal tract. The contrast solution may cause some diarrhea. Depending on your individual set of  symptoms, you may also receive an intravenous injection of x-ray contrast/dye. Plan on being at [facility] for 30 minutes or longer, depending on the type of exam you are having performed.  This test typically takes 30-45 minutes to complete.  If you have any questions regarding your exam or if you need to reschedule, you may call the CT department at [phone] between the hours of 8:00 am and 5:00 pm, Monday-Friday.  ________________________________________________________________________  Please purchase the following medications over the counter and take as directed: Probiotic  Please eat 3 meals a day  Start Elavil as well  Follow up in 12 weeks. Please call us to schedule an office visit at thaht time.   Thank you,  Dr. Jackquline Denmark

## 2020-03-19 NOTE — Progress Notes (Signed)
Chief Complaint: Longstanding abdominal complaints  Referring Provider:  Shelda Pal*      ASSESSMENT AND PLAN;   #1. IBS-alt diarrhea/constipation/abdo pain. Nl TSH.   #2. H/O colon Ca 2005 s/p sigmoid resection. Last colon: neg 06/2018  #3. RUQ pain. Neg EGD prev, s/p lap chole  Plan: -CBC, CMP, CRP, lipase, celiac screen, CEA -CT AP with PO IV contrast. -Probiotics 1/day. -3 meals/day -Start elavil 25mg   qhs -FU in 12 weeks.  -Next routine colonoscopy due 06/2023.   HPI:    Michelle Potts is a 50 y.o. female  With H/O anxiety, under considerable stress-husband has been diagnosed as having stage IV non-small cell lung cancer (undergoing immunotherapy), personal history of colon cancer s/p sigmoid resection.  Last colon[facility] 06/2018: neg with negative random colonic biopsies.  Anastomosis at 30 cm-patent.  Rpt 06/2023  C/O longstanding history of alternating diarrhea and constipation with lower abdominal cramps, upper abdominal discomfort and pain with bloating which gets better with defecation.  She has been diagnosed as having IBS with alternating diarrhea and constipation.  Given trial of Bentyl without any relief.  She does admit that stress makes her symptoms worse.  There is no history suggestive of lactose or gluten intolerance.  No melena or hematochezia.  The only other concern is that she has lost 45 pounds over the last 3 years.  She does admit that she only eats 1 meal a day and has not been eating well.  Denies having any significant upper GI symptoms including nausea, vomiting, heartburn, regurgitation, odynophagia or dysphagia.   Past GI procedures (at [facility]) Colonoscopy 06/2018: neg, neg random colonic biopsies.  Anastomosis at 30 cm.  Next due 06/2023 Colonoscopy 06/2014: neg EGD 04/2013: Normal.  Negative duodenal biopsies for celiac.  Past Medical History:  Diagnosis Date  . Colorectal cancer ([place]) 2005  . IBS  (irritable bowel syndrome)     Past Surgical History:  Procedure Laterality Date  . ABDOMINAL HYSTERECTOMY    . APPENDECTOMY    . BOWEL RESECTION    . CHOLECYSTECTOMY    . COLONOSCOPY  06/24/2014   [facility]  . ESOPHAGOGASTRODUODENOSCOPY  05/17/2013   [facility] Everywhere    Family History  Problem Relation Age of Onset  . Stroke Father     Social History   Tobacco Use  . Smoking status: Former Smoker    Types: Cigarettes  . Smokeless tobacco: Never Used  Vaping Use  . Vaping Use: Never used  Substance Use Topics  . Alcohol use: Yes    Comment: socially  . Drug use: No    Current Outpatient Medications  Medication Sig Dispense Refill  . amitriptyline (ELAVIL) 25 MG tablet Take 1 tablet (25 mg total) by mouth at bedtime. 30 tablet 2  . levothyroxine (SYNTHROID) 88 MCG tablet Take one tab po daily. BRAND NAME ONLY. DAW/1    . SUMAtriptan (IMITREX) 25 MG tablet Take by mouth.    . traZODone (DESYREL) 50 MG tablet Take by mouth.     No current facility-administered medications for this visit.    Allergies  Allergen Reactions  . Codeine Nausea And Vomiting    Review of Systems:  Constitutional: Denies fever, chills, diaphoresis, appetite change and has fatigue.  HEENT: Denies photophobia, eye pain, redness, hearing loss, ear pain, congestion, sore throat, rhinorrhea, sneezing, mouth sores, neck pain, neck stiffness and tinnitus.   Respiratory: Denies SOB, DOE, cough, chest tightness,  and wheezing.   Cardiovascular: Denies chest pain, palpitations and leg swelling.  Genitourinary: Denies dysuria, urgency, frequency, hematuria, flank pain and difficulty urinating.  Musculoskeletal: Denies myalgias, back pain, joint swelling, arthralgias and gait problem.  Skin: No rash.  Neurological: Denies dizziness, seizures, syncope, weakness, light-headedness, numbness and headaches.  Hematological: Denies adenopathy. Easy bruising, personal or  family bleeding history  Psychiatric/Behavioral: Has anxiety or depression     Physical Exam:    BP 112/76 (BP Location: Left Arm, Patient Position: Sitting, Cuff Size: Normal)   Pulse 76   Ht 5\' 9"  (1.753 m)   Wt 174 lb 2 oz (79 kg)   BMI 25.71 kg/m  Wt Readings from Last 3 Encounters:  03/19/20 174 lb 2 oz (79 kg)  03/04/20 148 lb 8 oz (67.4 kg)  09/19/13 204 lb (92.5 kg)   Constitutional:  Well-developed, in no acute distress. Psychiatric: Normal mood and affect. Behavior is normal. HEENT: Pupils normal.  Conjunctivae are normal. No scleral icterus. Neck supple.  Cardiovascular: Normal rate, regular rhythm. No edema Pulmonary/chest: Effort normal and breath sounds normal. No wheezing, rales or rhonchi. Abdominal: Soft, nondistended. Nontender.  Some muscle wall tenderness.  Bowel sounds active throughout. There are no masses palpable. No hepatomegaly. Rectal: Deferred Neurological: Alert and oriented to person place and time. Skin: Skin is warm and dry. No rashes noted.  Data Reviewed: I have personally reviewed following labs and imaging studies    Carmell Austria, MD 03/19/2020, 9:09 AM  Cc: Shelda Pal*

## 2020-03-19 NOTE — Patient Instructions (Signed)
Have them switch to my name if there are any issues with drawing labs.  Keep the diet clean and stay active.  Try to drink 55-60 oz of water daily outside of exercise.  We will be in touch regarding your X-ray.  If you do not hear anything about your referral in the next 1-2 weeks, call our office and ask for an update.  Do not drink alcohol, do any illicit/street drugs, drive or do anything that requires alertness while on the Ativan.   Let us know if you need anything.

## 2020-03-19 NOTE — Progress Notes (Signed)
Please inform the patient. All results normal or at baseline. Mild anemia Celiac, CEA- Pending CT- pending Neg colon and EGD in past Send report to family physician

## 2020-03-19 NOTE — Progress Notes (Signed)
Chief Complaint  Patient presents with  . exposure to shingles    Michelle Potts here for URI complaints.  Duration: 5 days  Associated symptoms: sinus congestion, low-grade fevers with a T-max of 100.5 F, nausea, sinus headache, ear pain and shortness of breath Denies: sinus pain, rhinorrhea, ear fullness, ear drainage, sore throat, wheezing, urinary complaints and Vomiting, diarrhea Treatment to date: Tylenol Sick contacts: No   She has an itchy rash over her chest centrally.  No new lotions, soaps, topicals, or detergents.  There is no pain or drainage.  No sick contacts.  She has tried an over-the-counter itch cream without significant relief.  She continues to have chest wall pain.  She was referred to physical therapy but did not set up an appointment.  No recent changes.  The patient has not started the Elavil but did start hydroxyzine that was not helpful.  Requesting Ativan as it has worked well in the past.  She would not use it daily.  Past Medical History:  Diagnosis Date  . Colorectal cancer ([place]) 2005  . IBS (irritable bowel syndrome)     BP 114/80 (BP Location: Left Arm, Patient Position: Sitting, Cuff Size: Normal)   Pulse 79   Temp 97.6 F (36.4 C) (Oral)   Ht 5\' 9"  (1.753 m)   Wt 147 lb (66.7 kg)   SpO2 98%   BMI 21.71 kg/m  General: Awake, alert, appears stated age HEENT: AT, [place], ears patent b/l and TM's neg, nares patent w/o discharge, pharynx pink and without exudates, MMM Neck: No masses or asymmetry Heart: RRR Abdomen: Diffusely tender with worse pain in the right upper quadrant, negative Murphy sign Lungs: CTAB, no accessory muscle use Psych: Age appropriate judgment and insight, normal mood and affect  Acute gastritis without hemorrhage, unspecified gastritis type - Plan: ondansetron (ZOFRAN-ODT) 4 MG disintegrating tablet  Dermatitis - Plan: triamcinolone ointment (KENALOG) 0.5 %  Chest wall pain - Plan: Ambulatory referral to Physical Therapy,  DG Chest 2 View  Situational anxiety - Plan: LORazepam (ATIVAN) 0.5 MG tablet  1.  Zofran as needed, push fluids. 2.  Twice daily Kenalog for up to 2 weeks. 3.  Refer to physical therapy, chest x-ray today. 4.  Stop hydroxyzine, start Ativan.  Would recommend she start Elavil as well. F/u prn. If starting to experience fevers, shaking, or shortness of breath, seek immediate care. Pt voiced understanding and agreement to the plan.  [place], DO 03/19/20 11:03 AM

## 2020-03-20 LAB — CEA: CEA: 3.3 ng/mL — ABNORMAL HIGH

## 2020-03-21 LAB — CELIAC PANEL 10
Antigliadin Abs, IgA: 4 units (ref 0–19)
Endomysial IgA: NEGATIVE
Gliadin IgG: 1 units (ref 0–19)
IgA/Immunoglobulin A, Serum: 567 mg/dL — ABNORMAL HIGH (ref 87–352)
Tissue Transglut Ab: <2 U/mL (ref 0–5)
Transglutaminase IgA: <2 U/mL (ref 0–3)

## 2020-03-24 ENCOUNTER — Telehealth: Payer: Self-pay | Admitting: Gastroenterology

## 2020-03-24 NOTE — Telephone Encounter (Signed)
[employer] representative calling to have another provider to contact [facility] [phone] case# [id number] to advise what site the patient would be able to proceed with and\or do a peer to peer to do appeal for [facility] site [phone] opt 4.

## 2020-03-24 NOTE — Telephone Encounter (Signed)
Contacted the number provided below. Spent over an hour on hold with no answerer. Will wait for another call that should be transferred directly to my phone and not send a message.

## 2020-03-25 ENCOUNTER — Other Ambulatory Visit: Payer: Self-pay | Admitting: Gastroenterology

## 2020-03-25 DIAGNOSIS — R1011 Right upper quadrant pain: Secondary | ICD-10-CM

## 2020-03-25 NOTE — Telephone Encounter (Signed)
Michelle Potts, patient has been approved to have her CT scan done at [place]. [place] [address] Auth# [id number]   Valid 03/25/2020 to 06/23/2020

## 2020-03-25 NOTE — Telephone Encounter (Signed)
Patient is scheduled for CT at [place] on 04/08/20 at 12:40 pm. We discussed her instructions over the phone. She will review in My Chart,and call with any further questions.

## 2020-03-26 ENCOUNTER — Ambulatory Visit (HOSPITAL_BASED_OUTPATIENT_CLINIC_OR_DEPARTMENT_OTHER): Payer: Managed Care, Other (non HMO)

## 2020-04-01 ENCOUNTER — Encounter: Payer: Self-pay | Admitting: Family Medicine

## 2020-04-01 ENCOUNTER — Other Ambulatory Visit: Payer: Self-pay

## 2020-04-01 ENCOUNTER — Telehealth (INDEPENDENT_AMBULATORY_CARE_PROVIDER_SITE_OTHER): Payer: Managed Care, Other (non HMO) | Admitting: Family Medicine

## 2020-04-01 DIAGNOSIS — F418 Other specified anxiety disorders: Secondary | ICD-10-CM | POA: Diagnosis not present

## 2020-04-01 MED ORDER — AMITRIPTYLINE HCL 50 MG PO TABS: 50.0000 mg | ORAL_TABLET | Freq: Every day | ORAL | 2 refills | Status: DC

## 2020-04-01 MED ORDER — VENLAFAXINE HCL ER 37.5 MG PO CP24: 37.5000 mg | ORAL_CAPSULE | Freq: Every day | ORAL | 3 refills | Status: DC

## 2020-04-01 NOTE — Progress Notes (Signed)
CC: F/u  Subjective Michelle Potts presents for f/u anxiety/depression. Due to COVID-19 pandemic, we are interacting via web portal for an electronic face-to-face visit. I verified patient's ID using 2 identifiers. Patient agreed to proceed with visit via this method. Patient is at home, I am at office. Patient and I are present for visit.   Pt is currently being treated with Elavil 25 mg qhs and Ativan 0.5 mg prn.  Reports doing OK since treatment. Elavil helped her sleep initially and then stopped working as well.  No thoughts of harming self or others. No self-medication with alcohol, prescription drugs or illicit drugs. Pt is not following with a counselor/psychologist.  Past Medical History:  Diagnosis Date  . Colorectal cancer ([place]) 2005  . IBS (irritable bowel syndrome)    Exam No conversational dyspnea Age appropriate judgment and insight Nml affect and mood  Assessment and Plan  Situational anxiety - Plan: amitriptyline (ELAVIL) 50 MG tablet, venlafaxine XR (EFFEXOR-XR) 37.5 MG 24 hr capsule  Pt was on Effexor in the past, will restart low dosage. Increase Elavil to 50 mg qhs. Cont Ativan prn. F/u in 6 weeks.  The patient voiced understanding and agreement to the plan.  [place], DO 04/01/20 9:06 AM

## 2020-04-08 ENCOUNTER — Ambulatory Visit
Admission: RE | Admit: 2020-04-08 | Discharge: 2020-04-08 | Disposition: A | Discharge status: Home / Self Care | Payer: Managed Care, Other (non HMO) | Source: Ambulatory Visit | Attending: Gastroenterology | Admitting: Gastroenterology

## 2020-04-08 DIAGNOSIS — R1011 Right upper quadrant pain: Secondary | ICD-10-CM

## 2020-04-08 MED ORDER — IOPAMIDOL (ISOVUE-300) INJECTION 61%
100.0000 mL | Freq: Once | INTRAVENOUS | Status: AC | PRN
  Administered 2020-04-08: 100 mL via INTRAVENOUS

## 2020-04-11 ENCOUNTER — Other Ambulatory Visit: Payer: Self-pay

## 2020-04-11 DIAGNOSIS — Z85038 Personal history of other malignant neoplasm of large intestine: Secondary | ICD-10-CM

## 2020-04-11 NOTE — Progress Notes (Signed)
Patients needs to have lab work down in [employer]

## 2020-06-10 ENCOUNTER — Encounter: Payer: Self-pay | Admitting: Family Medicine

## 2020-06-10 ENCOUNTER — Ambulatory Visit (INDEPENDENT_AMBULATORY_CARE_PROVIDER_SITE_OTHER): Payer: Managed Care, Other (non HMO) | Admitting: Family Medicine

## 2020-06-10 ENCOUNTER — Other Ambulatory Visit: Payer: Self-pay

## 2020-06-10 VITALS — BP 110/68 | HR 77 | Temp 98.3°F | Ht 68.0 in | Wt 139.1 lb

## 2020-06-10 DIAGNOSIS — E538 Deficiency of other specified B group vitamins: Secondary | ICD-10-CM

## 2020-06-10 DIAGNOSIS — Z0000 Encounter for general adult medical examination without abnormal findings: Secondary | ICD-10-CM

## 2020-06-10 DIAGNOSIS — Z1159 Encounter for screening for other viral diseases: Secondary | ICD-10-CM

## 2020-06-10 DIAGNOSIS — Z114 Encounter for screening for human immunodeficiency virus [HIV]: Secondary | ICD-10-CM

## 2020-06-10 DIAGNOSIS — Z23 Encounter for immunization: Secondary | ICD-10-CM | POA: Diagnosis not present

## 2020-06-10 DIAGNOSIS — R7989 Other specified abnormal findings of blood chemistry: Secondary | ICD-10-CM | POA: Diagnosis not present

## 2020-06-10 DIAGNOSIS — E039 Hypothyroidism, unspecified: Secondary | ICD-10-CM | POA: Diagnosis not present

## 2020-06-10 DIAGNOSIS — F418 Other specified anxiety disorders: Secondary | ICD-10-CM | POA: Diagnosis not present

## 2020-06-10 DIAGNOSIS — F321 Major depressive disorder, single episode, moderate: Secondary | ICD-10-CM | POA: Diagnosis not present

## 2020-06-10 DIAGNOSIS — I700 Atherosclerosis of aorta: Secondary | ICD-10-CM | POA: Insufficient documentation

## 2020-06-10 LAB — COMPREHENSIVE METABOLIC PANEL
ALT: 9 U/L (ref 0–35)
AST: 14 U/L (ref 0–37)
Albumin: 3.9 g/dL (ref 3.5–5.2)
Alkaline Phosphatase: 53 U/L (ref 39–117)
BUN: 10 mg/dL (ref 6–23)
CO2: 34 mEq/L — ABNORMAL HIGH (ref 19–32)
Calcium: 8.9 mg/dL (ref 8.4–10.5)
Chloride: 99 mEq/L (ref 96–112)
Creatinine, Ser: 0.8 mg/dL (ref 0.40–1.20)
GFR: 85.45 mL/min (ref >60.00)
Glucose, Bld: 91 mg/dL (ref 70–99)
Potassium: 3.7 mEq/L (ref 3.5–5.1)
Sodium: 139 mEq/L (ref 135–145)
Total Bilirubin: 0.3 mg/dL (ref 0.2–1.2)
Total Protein: 6.7 g/dL (ref 6.0–8.3)

## 2020-06-10 LAB — TSH: TSH: 5.05 u[IU]/mL — ABNORMAL HIGH (ref 0.35–4.50)

## 2020-06-10 LAB — CBC
HCT: 33.9 % — ABNORMAL LOW (ref 36.0–46.0)
Hemoglobin: 11.4 g/dL — ABNORMAL LOW (ref 12.0–15.0)
MCHC: 33.5 g/dL (ref 30.0–36.0)
MCV: 91.2 fl (ref 78.0–100.0)
Platelets: 182 10*3/uL (ref 150.0–400.0)
RBC: 3.72 Mil/uL — ABNORMAL LOW (ref 3.87–5.11)
RDW: 13.4 % (ref 11.5–15.5)
WBC: 3.5 10*3/uL — ABNORMAL LOW (ref 4.0–10.5)

## 2020-06-10 LAB — VITAMIN D 25 HYDROXY (VIT D DEFICIENCY, FRACTURES): VITD: 22.9 ng/mL — ABNORMAL LOW (ref 30.00–100.00)

## 2020-06-10 LAB — LIPID PANEL
Cholesterol: 173 mg/dL (ref 0–200)
HDL: 59.4 mg/dL (ref >39.00)
LDL Cholesterol: 100 mg/dL — ABNORMAL HIGH (ref 0–99)
NonHDL: 113.47
Total CHOL/HDL Ratio: 3
Triglycerides: 67 mg/dL (ref 0.0–149.0)
VLDL: 13.4 mg/dL (ref 0.0–40.0)

## 2020-06-10 LAB — T4, FREE: Free T4: 0.66 ng/dL (ref 0.60–1.60)

## 2020-06-10 LAB — VITAMIN B12: Vitamin B-12: 236 pg/mL (ref 211–911)

## 2020-06-10 MED ORDER — VENLAFAXINE HCL ER 75 MG PO CP24: 75.0000 mg | ORAL_CAPSULE | Freq: Every day | ORAL | 2 refills | Status: DC

## 2020-06-10 MED ORDER — ROSUVASTATIN CALCIUM 20 MG PO TABS: 20.0000 mg | ORAL_TABLET | Freq: Every day | ORAL | 3 refills | Status: DC

## 2020-06-10 MED ORDER — LORAZEPAM 0.5 MG PO TABS: 0.5000 mg | ORAL_TABLET | Freq: Two times a day (BID) | ORAL | 1 refills | Status: DC | PRN

## 2020-06-10 NOTE — Progress Notes (Signed)
Chief Complaint  Patient presents with  . Annual Exam     Well Woman Michelle Potts is here for a complete physical.   Her last physical was >1 year ago.  Current diet: in general, she is eating less. Current exercise: none. Weight is stable and she confirms fatigue out of ordinary. Seatbelt? Yes  Health Maintenance Mammogram- Yes Colon cancer screening-Yes Shingrix- No Tetanus- No Hep C screening- No HIV screening- No   The patient's mood is not good.  She has depression, anxiety, fatigue, hypersomnolence, insomnia, worsening reflux symptoms, and various pains.  Her husband has stage IV cancer.  This is been particularly stressful for her.  She started venlafaxine 37.5 mg daily which is slightly helpful.  Hearing bad news regarding her husband's health tends to be the biggest trigger causing panic attacks.  Ativan usually helps but did not help most recently.  She is taking amitriptyline 50 mg nightly for sleep.  That is helpful.  She is not following with a counselor or psychologist.  No homicidal or suicidal ideation.  No self-medication.  Past Medical History:  Diagnosis Date  . Colorectal cancer ([place]) 2005  . IBS (irritable bowel syndrome)      Past Surgical History:  Procedure Laterality Date  . ABDOMINAL HYSTERECTOMY    . APPENDECTOMY    . BOWEL RESECTION    . CHOLECYSTECTOMY    . COLONOSCOPY  06/24/2014   [facility]  . ESOPHAGOGASTRODUODENOSCOPY  05/17/2013   [facility] Everywhere    Medications  Current Outpatient Medications on File Prior to Visit  Medication Sig Dispense Refill  . amitriptyline (ELAVIL) 50 MG tablet Take 1 tablet (50 mg total) by mouth at bedtime. 30 tablet 2  . levothyroxine (SYNTHROID) 88 MCG tablet Take one tab po daily. BRAND NAME ONLY. DAW/1    . triamcinolone ointment (KENALOG) 0.5 % Apply 1 application topically 2 (two) times daily. 30 g 0  . venlafaxine XR (EFFEXOR-XR) 37.5 MG 24 hr capsule Take 1 capsule  (37.5 mg total) by mouth daily with breakfast. 30 capsule 3  . LORazepam (ATIVAN) 0.5 MG tablet Take 1 tablet (0.5 mg total) by mouth 2 (two) times daily as needed for anxiety. (Patient not taking: Reported on 06/10/2020) 30 tablet 1   Allergies Allergies  Allergen Reactions  . Codeine Nausea And Vomiting  . Iodinated Diagnostic Agents Itching    Itching immediately after contrast injection. Treated with Benadryl onsite. Must be pre-medicated prior to receiving any Iodinated contrast media in the future.    Review of Systems: Constitutional:  no unexpected weight changes Eye:  no recent significant change in vision Ear/Nose/Mouth/Throat:  Ears:  no recent change in hearing Nose/Mouth/Throat:  no complaints of nasal congestion, no sore throat Cardiovascular: no chest pain Respiratory:  no shortness of breath Gastrointestinal:  no abdominal pain, no change in bowel habits GU:  Female: negative for dysuria or pelvic pain Musculoskeletal/Extremities:  no pain of the joints Integumentary (Skin/Breast):  no abnormal skin lesions reported Neurologic:  no headaches Endocrine:  denies fatigue  Exam BP 110/68 (BP Location: Left Arm, Patient Position: Sitting, Cuff Size: Normal)   Pulse 77   Temp 98.3 F (36.8 C) (Oral)   Ht 5\' 8"  (1.727 m)   Wt 139 lb 2 oz (63.1 kg)   SpO2 96%   BMI 21.15 kg/m  General:  well developed, well nourished, in no apparent distress Skin:  no significant moles, warts, or growths Head:  no masses, lesions,  or tenderness Eyes:  pupils equal and round, sclera anicteric without injection Ears:  canals without lesions, TMs shiny without retraction, no obvious effusion, no erythema Nose:  nares patent, septum midline, mucosa normal, and no drainage or sinus tenderness Throat/Pharynx:  lips and gingiva without lesion; tongue and uvula midline; non-inflamed pharynx; no exudates or postnasal drainage Neck: neck supple without adenopathy, thyromegaly, or masses Lungs:   clear to auscultation, breath sounds equal bilaterally, no respiratory distress Cardio:  regular rate and rhythm, no LE edema Abdomen:  abdomen soft, nontender; bowel sounds normal; no masses or organomegaly Genital: Defer to GYN Musculoskeletal:  symmetrical muscle groups noted without atrophy or deformity Extremities:  no clubbing, cyanosis, or edema, no deformities, no skin discoloration Neuro:  gait normal; deep tendon reflexes normal and symmetric Psych: well oriented with normal range of affect and appropriate judgment/insight  Assessment and Plan  Well adult exam - Plan: CBC, Comprehensive metabolic panel, Lipid panel  Situational anxiety - Plan: LORazepam (ATIVAN) 0.5 MG tablet, venlafaxine XR (EFFEXOR XR) 75 MG 24 hr capsule  Depression, major, single episode, moderate (HCC) - Plan: venlafaxine XR (EFFEXOR XR) 75 MG 24 hr capsule  Low vitamin B12 level - Plan: B12  Low vitamin D level - Plan: VITAMIN D 25 Hydroxy (Vit-D Deficiency, Fractures)  Hypothyroidism, unspecified type - Plan: TSH, T4, free  Encounter for hepatitis C screening test for low risk patient - Plan: Hepatitis C antibody  Screening for HIV (human immunodeficiency virus) - Plan: HIV Antibody (routine testing w rflx)  Aortic atherosclerosis ([place]) - Plan: rosuvastatin (CRESTOR) 20 MG tablet  Need for Tdap vaccination - Plan: Tdap vaccine greater than or equal to 31yo IM   Well 50 y.o. female. Counseled on diet and exercise. Other orders as above. Anxiety/depression: Chronic, uncontrolled.  Increase dosage of Effexor from 37.5 mg daily to 75 mg daily.  Increase as needed doses range of Ativan from 0.5 to 0.5 to 1 mg as needed. Aortic atherosclerosis noted on CT imaging.  We will also prescribe Crestor 20 mg daily.  Recheck labs at follow-up visit. Follow up in 1 mo. The patient voiced understanding and agreement to the plan.  Todd, DO 06/10/20 12:10 PM

## 2020-06-10 NOTE — Patient Instructions (Addendum)
Give [place] 2-3 business days to get the results of your labs back.   Keep the diet clean and stay active.  Try Pepcid for your reflux symptoms.  Please consider counseling. Contact [phone] to schedule an appointment or inquire about cost/insurance coverage.  The new Shingrix vaccine (for shingles) is a 2 shot series. It can make people feel low energy, achy and almost like they have the flu for 48 hours after injection. Please plan accordingly when deciding on when to get this shot. Call our office for a nurse visit appointment to get this. The second shot of the series is less severe regarding the side effects, but it still lasts 48 hours.   Let us know if you need anything.

## 2020-06-11 ENCOUNTER — Other Ambulatory Visit: Payer: Self-pay | Admitting: Family Medicine

## 2020-06-11 ENCOUNTER — Other Ambulatory Visit (INDEPENDENT_AMBULATORY_CARE_PROVIDER_SITE_OTHER): Payer: Managed Care, Other (non HMO)

## 2020-06-11 DIAGNOSIS — D72818 Other decreased white blood cell count: Secondary | ICD-10-CM

## 2020-06-11 DIAGNOSIS — E611 Iron deficiency: Secondary | ICD-10-CM

## 2020-06-11 LAB — HIV ANTIBODY (ROUTINE TESTING W REFLEX): HIV 1&2 Ab, 4th Generation: NONREACTIVE

## 2020-06-11 LAB — HEPATITIS C ANTIBODY
Hepatitis C Ab: NONREACTIVE
SIGNAL TO CUT-OFF: 0 (ref <1.00)

## 2020-06-11 LAB — IBC + FERRITIN
Ferritin: 66.7 ng/mL (ref 10.0–291.0)
Iron: 38 ug/dL — ABNORMAL LOW (ref 42–145)
Saturation Ratios: 14.9 % — ABNORMAL LOW (ref 20.0–50.0)
Transferrin: 182 mg/dL — ABNORMAL LOW (ref 212.0–360.0)

## 2020-06-12 ENCOUNTER — Other Ambulatory Visit: Payer: Managed Care, Other (non HMO)

## 2020-06-13 ENCOUNTER — Other Ambulatory Visit (INDEPENDENT_AMBULATORY_CARE_PROVIDER_SITE_OTHER): Payer: Managed Care, Other (non HMO)

## 2020-06-13 ENCOUNTER — Other Ambulatory Visit: Payer: Self-pay

## 2020-06-13 DIAGNOSIS — E611 Iron deficiency: Secondary | ICD-10-CM

## 2020-06-13 LAB — URINALYSIS, MICROSCOPIC ONLY

## 2020-06-25 ENCOUNTER — Telehealth: Payer: Self-pay

## 2020-06-25 ENCOUNTER — Other Ambulatory Visit: Payer: Self-pay

## 2020-06-25 ENCOUNTER — Telehealth: Payer: Managed Care, Other (non HMO) | Admitting: Family Medicine

## 2020-06-25 ENCOUNTER — Encounter: Payer: Self-pay | Admitting: Family Medicine

## 2020-06-25 DIAGNOSIS — E039 Hypothyroidism, unspecified: Secondary | ICD-10-CM

## 2020-06-25 DIAGNOSIS — F411 Generalized anxiety disorder: Secondary | ICD-10-CM | POA: Diagnosis not present

## 2020-06-25 DIAGNOSIS — R109 Unspecified abdominal pain: Secondary | ICD-10-CM | POA: Diagnosis not present

## 2020-06-25 MED ORDER — SYNTHROID 100 MCG PO TABS: 100.0000 ug | ORAL_TABLET | Freq: Every day | ORAL | 2 refills | Status: DC

## 2020-06-25 MED ORDER — VENLAFAXINE HCL ER 37.5 MG PO CP24: 37.5000 mg | ORAL_CAPSULE | Freq: Every day | ORAL | 2 refills | Status: DC

## 2020-06-25 MED ORDER — AMITRIPTYLINE HCL 100 MG PO TABS: 100.0000 mg | ORAL_TABLET | Freq: Every day | ORAL | 2 refills | Status: DC

## 2020-06-25 MED ORDER — CLONAZEPAM 0.5 MG PO TABS: 0.2500 mg | ORAL_TABLET | Freq: Two times a day (BID) | ORAL | 1 refills | Status: DC | PRN

## 2020-06-25 NOTE — Telephone Encounter (Addendum)
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

## 2020-06-25 NOTE — Progress Notes (Signed)
Chief Complaint  Patient presents with  . discuss medication    Thyroid medication and Ativan    Subjective: Patient is a 50 y.o. female here for f/u. Due to COVID-19 pandemic, we are interacting via web portal for an electronic face-to-face visit. I verified patient's ID using 2 identifiers. Patient agreed to proceed with visit via this method. Patient is at home, I am at office. Patient and I are present for visit.   Patient has a history of hypothyroidism.  Free T4 was on the lower end of normal at her last check.  She is taking 88 mcg of Synthroid daily.  She reports compliance.  She is having a lot of fatigue.  Patient has a history of generalized anxiety with some depression.  She is taking venlafaxine 75 mg daily in addition to Elavil 50 mg daily.  This doubles for treatment with abdominal cramping that is helping.  She would like to go up on the amitriptyline dosage.  She takes Ativan as needed which has not been helpful.  She is interested in changing it to something else.  No adverse reactions with the Ativan.  Past Medical History:  Diagnosis Date  . Colorectal cancer ([place]) 2005  . IBS (irritable bowel syndrome)     Objective: No conversational dyspnea Age appropriate judgment and insight Nml affect and mood  Assessment and Plan: Hypothyroidism, unspecified type - Plan: SYNTHROID 100 MCG tablet  GAD (generalized anxiety disorder) - Plan: venlafaxine XR (EFFEXOR XR) 37.5 MG 24 hr capsule, clonazePAM (KLONOPIN) 0.5 MG tablet  Abdominal cramping - Plan: amitriptyline (ELAVIL) 100 MG tablet  1. Increase Synthroid from 88 mcg/d to 100 mcg/d to see if we can optimize her s/s's and hormone levels.  2.  Chronic, uncontrolled.  Decrease Effexor from 75 mg daily to 37.5 mg daily.  We are increasing amitriptyline from 50 mg daily to 100 mg daily.  Change Ativan to Klonopin 0.5 mg twice daily as needed.  She has an appoint with a counselor coming up. 3.  Increase amitriptyline from 50  mg daily to 100 mg daily.  If she starts getting worsening fatigue, she will let me know as this is likely going to be a side effect from the Elavil. Follow-up in 6 weeks. The patient voiced understanding and agreement to the plan.  [place] Mel, DO 06/25/20  2:02 PM

## 2020-07-14 ENCOUNTER — Ambulatory Visit (INDEPENDENT_AMBULATORY_CARE_PROVIDER_SITE_OTHER): Payer: 59 | Admitting: Psychologist

## 2020-07-14 DIAGNOSIS — F331 Major depressive disorder, recurrent, moderate: Secondary | ICD-10-CM | POA: Diagnosis not present

## 2020-07-22 ENCOUNTER — Ambulatory Visit: Payer: 59 | Admitting: Psychologist

## 2020-07-28 ENCOUNTER — Ambulatory Visit: Payer: 59 | Admitting: Psychologist

## 2020-08-07 ENCOUNTER — Ambulatory Visit: Payer: 59 | Admitting: Psychology

## 2020-09-25 ENCOUNTER — Telehealth: Payer: Managed Care, Other (non HMO) | Admitting: Family Medicine

## 2020-09-25 ENCOUNTER — Other Ambulatory Visit: Payer: Self-pay

## 2020-09-29 ENCOUNTER — Other Ambulatory Visit: Payer: Self-pay

## 2020-09-29 ENCOUNTER — Ambulatory Visit (INDEPENDENT_AMBULATORY_CARE_PROVIDER_SITE_OTHER): Payer: Managed Care, Other (non HMO) | Admitting: Family Medicine

## 2020-09-29 ENCOUNTER — Encounter: Payer: Self-pay | Admitting: Family Medicine

## 2020-09-29 VITALS — BP 118/68 | HR 93 | Temp 98.4°F | Ht 69.0 in | Wt 154.0 lb

## 2020-09-29 DIAGNOSIS — R109 Unspecified abdominal pain: Secondary | ICD-10-CM | POA: Diagnosis not present

## 2020-09-29 DIAGNOSIS — F411 Generalized anxiety disorder: Secondary | ICD-10-CM

## 2020-09-29 DIAGNOSIS — R194 Change in bowel habit: Secondary | ICD-10-CM

## 2020-09-29 MED ORDER — DICYCLOMINE HCL 10 MG PO CAPS: ORAL_CAPSULE | ORAL | 0 refills | Status: DC

## 2020-09-29 MED ORDER — NORTRIPTYLINE HCL 50 MG PO CAPS: 50.0000 mg | ORAL_CAPSULE | Freq: Every day | ORAL | 3 refills | Status: DC

## 2020-09-29 NOTE — Patient Instructions (Signed)
Keep an eye on food triggers.  Stay hydrated.  Stop the Elavil.   Let us know if you need anything.

## 2020-09-29 NOTE — Progress Notes (Signed)
Chief Complaint  Patient presents with   Abdominal Pain    Problems with stools   Fatigue    Subjective: Patient is a 50 y.o. female here for stool problems.  15 d has been having looser stools. She is having cramping over the LLQ. She has associated nausea. Eating causes her to use the restroom and she will have the abdominal pain. No bleeding, mucus, vomiting, fevers, unintentional wt loss. She is taking Effexor XR 37.5 mg/d, Elavil 100 mg/d.  Elavil is helping initially but not so much lately.  Stress has been high with the health of her husband.  She has associated fatigue which she attributes to poor sleep.  She feels that he can stay awake until she knows her husband is sleeping and then cannot fall asleep on her own.  She ends up sleeping most of the day.  Past Medical History:  Diagnosis Date   Colorectal cancer ([place]) 2005   IBS (irritable bowel syndrome)     Objective: BP 118/68   Pulse 93   Temp 98.4 F (36.9 C) (Oral)   Ht '5\' 9"'$  (1.753 m)   Wt 154 lb (69.9 kg)   SpO2 99%   BMI 22.74 kg/m  General: Awake, appears stated age Heart: RRR Lungs: CTAB, no rales, wheezes or rhonchi. No accessory muscle use Abdomen: Bowel sounds present, soft, mild tenderness to palpation of the left lower quadrant, no masses or organomegaly Psych: Age appropriate judgment and insight, normal affect and mood  Assessment and Plan: GAD (generalized anxiety disorder) - Plan: nortriptyline (PAMELOR) 50 MG capsule  Bowel habit changes - Plan: nortriptyline (PAMELOR) 50 MG capsule  Abdominal cramping - Plan: dicyclomine (BENTYL) 10 MG capsule, nortriptyline (PAMELOR) 50 MG capsule  Chronic, uncontrolled.  She appears to be very anxious overall.  I think this is affecting her sleep.  She is following with a counselor who she is not pleased with.  We will continue her low-dose of Effexor, change Elavil to Pamelor 50 mg daily.  For cramping which could be related to anxiety/IBS, we will add Bentyl  as needed.  I would like to see her in 1 month.  She brought in several pictures of food and her stool which I do not find/see any abnormalities in. The patient voiced understanding and agreement to the plan.  [place], DO 09/29/20  2:51 PM

## 2020-10-01 ENCOUNTER — Other Ambulatory Visit: Payer: Self-pay | Admitting: Family Medicine

## 2020-10-01 MED ORDER — CLONAZEPAM 1 MG PO TABS: 0.5000 mg | ORAL_TABLET | Freq: Two times a day (BID) | ORAL | 1 refills | Status: DC | PRN

## 2020-10-01 MED ORDER — HYOSCYAMINE SULFATE ER 0.375 MG PO TB12: 0.3750 mg | ORAL_TABLET | Freq: Two times a day (BID) | ORAL | 0 refills | Status: DC

## 2020-10-10 ENCOUNTER — Other Ambulatory Visit: Payer: Self-pay | Admitting: Family Medicine

## 2020-10-10 DIAGNOSIS — R109 Unspecified abdominal pain: Secondary | ICD-10-CM

## 2020-11-07 ENCOUNTER — Encounter: Payer: Self-pay | Admitting: Family Medicine

## 2020-11-07 ENCOUNTER — Ambulatory Visit (INDEPENDENT_AMBULATORY_CARE_PROVIDER_SITE_OTHER): Payer: Managed Care, Other (non HMO) | Admitting: Family Medicine

## 2020-11-07 ENCOUNTER — Other Ambulatory Visit: Payer: Self-pay

## 2020-11-07 VITALS — BP 120/80 | HR 75 | Temp 98.0°F | Resp 18 | Ht 69.0 in | Wt 145.0 lb

## 2020-11-07 DIAGNOSIS — R195 Other fecal abnormalities: Secondary | ICD-10-CM | POA: Diagnosis not present

## 2020-11-07 DIAGNOSIS — L603 Nail dystrophy: Secondary | ICD-10-CM

## 2020-11-07 DIAGNOSIS — R251 Tremor, unspecified: Secondary | ICD-10-CM | POA: Diagnosis not present

## 2020-11-07 DIAGNOSIS — R109 Unspecified abdominal pain: Secondary | ICD-10-CM

## 2020-11-07 DIAGNOSIS — F411 Generalized anxiety disorder: Secondary | ICD-10-CM

## 2020-11-07 LAB — COMPREHENSIVE METABOLIC PANEL
ALT: 13 U/L (ref 0–35)
AST: 22 U/L (ref 0–37)
Albumin: 4 g/dL (ref 3.5–5.2)
Alkaline Phosphatase: 56 U/L (ref 39–117)
BUN: 8 mg/dL (ref 6–23)
CO2: 35 mEq/L — ABNORMAL HIGH (ref 19–32)
Calcium: 9.1 mg/dL (ref 8.4–10.5)
Chloride: 100 mEq/L (ref 96–112)
Creatinine, Ser: 0.83 mg/dL (ref 0.40–1.20)
GFR: 81.52 mL/min (ref >60.00)
Glucose, Bld: 80 mg/dL (ref 70–99)
Potassium: 3.6 mEq/L (ref 3.5–5.1)
Sodium: 141 mEq/L (ref 135–145)
Total Bilirubin: 0.4 mg/dL (ref 0.2–1.2)
Total Protein: 7 g/dL (ref 6.0–8.3)

## 2020-11-07 LAB — CBC
HCT: 35.7 % — ABNORMAL LOW (ref 36.0–46.0)
Hemoglobin: 11.5 g/dL — ABNORMAL LOW (ref 12.0–15.0)
MCHC: 32.3 g/dL (ref 30.0–36.0)
MCV: 92.3 fl (ref 78.0–100.0)
Platelets: 199 10*3/uL (ref 150.0–400.0)
RBC: 3.87 Mil/uL (ref 3.87–5.11)
RDW: 13.6 % (ref 11.5–15.5)
WBC: 3.3 10*3/uL — ABNORMAL LOW (ref 4.0–10.5)

## 2020-11-07 LAB — VITAMIN D 25 HYDROXY (VIT D DEFICIENCY, FRACTURES): VITD: 23.73 ng/mL — ABNORMAL LOW (ref 30.00–100.00)

## 2020-11-07 LAB — T4, FREE: Free T4: 0.77 ng/dL (ref 0.60–1.60)

## 2020-11-07 LAB — TSH: TSH: 2.77 u[IU]/mL (ref 0.35–5.50)

## 2020-11-07 NOTE — Patient Instructions (Addendum)
If you do not hear anything about your referral in the next 1-2 weeks, call our office and ask for an update.  Give [place] 2-3 business days to get the results of your labs back.   Keep the diet clean and stay active.  Consider a protein supplement like Ensure.   Please schedule an appointment with Dr. Lyndel Safe as soon as possible.  Let us know if you need anything.  [facility] [address] [place], [place] [address] [phone]  [facility] [address] [place], [place] [address] [phone]  [facility] [place], [place] [address] [phone]  [facility] [address], [place], [place], [phone] [address], [place], [place], [place]  [facility] [place], [place] and [place], [place], [place]  [facility] [place], [place]  Call one of these offices sooner than later as it can take 2-3 months to get a new patient appointment.

## 2020-11-07 NOTE — Progress Notes (Signed)
Chief Complaint  Patient presents with   1 month follow up    Bowel changes and GAD.  Concerns/ questions: brittle nails, feet are burning, edema in ankles, heart palpitations.  Flu shot today: decline     Subjective: Patient is a 50 y.o. female here for follow-up.  The patient continues to have issues with her anxiety.  She is taking venlafaxine XR 37.5 mg daily.  She reports compliance and no adverse effects.  She is unsure if this is helpful.  Klonopin is more helpful and she is ending up taking it daily.  She did not start nortriptyline due to concerns for UV light sensitivity and she was concerned they would go to the beach and it would be her last time with her husband.  He has stage IV lung cancer which has been quite stressful on her.  She is not currently following with a psychiatrist and is looking for a psychologist.  No homicidal or suicidal ideation.  No self-medication.  The patient continues to have chronic abdominal pain.  She saw the gastroenterology team and does not currently have a follow-up.  She has loose stools and sometimes has blood.  Sometimes mucus as well.  No nausea or vomiting.  Levbid is okay for her.  Nortriptyline was prescribed to help with this but again, she has not taken it yet.  She is not eating as much and getting a lot of her calories from [employer].  Over the past few weeks she has noticed her nails have become more brittle.  Dietary habits as above.  No new topicals or polishes.  Past Medical History:  Diagnosis Date   Colorectal cancer ([place]) 2005   IBS (irritable bowel syndrome)     Objective: BP 120/80 (BP Location: Left Arm, Patient Position: Sitting, Cuff Size: Normal)   Pulse 75   Temp 98 F (36.7 C) (Oral)   Resp 18   Ht 5\' 9"  (1.753 m)   Wt 145 lb (65.8 kg)   SpO2 96%   BMI 21.41 kg/m  General: Awake, appears stated age HEENT: MMM, EOMi Heart: RRR Abdomen: Bowel sounds present, soft, nondistended, diffusely tender to palpation worse  in the lower quadrants, no masses or organomegaly; negative Murphy's, Rovsing's, McBurney's, Carnett's Lungs: CTAB, no rales, wheezes or rhonchi. No accessory muscle use Psych: Age appropriate judgment and insight, normal affect and mood  Assessment and Plan: Brittle nails - Plan: CBC, Zinc, Comprehensive metabolic panel, VITAMIN D 25 Hydroxy (Vit-D Deficiency, Fractures)  Loose stools - Plan: Gastrointestinal Pathogen Panel PCR, Comprehensive metabolic panel, TSH, T4, free  Tremor  GAD (generalized anxiety disorder) - Plan: Ambulatory referral to Psychiatry  Abdominal cramping  Needs to increase protein intake. Ck some labs.  Check GI pathogen panel.  I recommended she follow-up with her gastroenterologist.  She should start nortriptyline.  Follow-up in 1 month. We will follow-up on thyroid levels. Refer to psychiatry and provided self-referral resources as well.  We will see how she does on nortriptyline 50 mg daily.  Continue venlafaxine XR 37.5 mg daily.  Continue Klonopin as needed as well. As above.  Continue Levbid. I will see her in 1 month. The patient voiced understanding and agreement to the plan.  I spent 40 minutes with the patient discussing the above in addition to reviewing her chart on the same day of the visit.  [place], DO 11/07/20  10:19 AM

## 2020-11-10 ENCOUNTER — Other Ambulatory Visit: Payer: Managed Care, Other (non HMO)

## 2020-11-10 ENCOUNTER — Other Ambulatory Visit (INDEPENDENT_AMBULATORY_CARE_PROVIDER_SITE_OTHER): Payer: Managed Care, Other (non HMO)

## 2020-11-10 DIAGNOSIS — K582 Mixed irritable bowel syndrome: Secondary | ICD-10-CM

## 2020-11-10 DIAGNOSIS — R1011 Right upper quadrant pain: Secondary | ICD-10-CM

## 2020-11-10 DIAGNOSIS — R109 Unspecified abdominal pain: Secondary | ICD-10-CM

## 2020-11-10 DIAGNOSIS — Z85038 Personal history of other malignant neoplasm of large intestine: Secondary | ICD-10-CM | POA: Diagnosis not present

## 2020-11-10 DIAGNOSIS — D649 Anemia, unspecified: Secondary | ICD-10-CM

## 2020-11-10 DIAGNOSIS — E611 Iron deficiency: Secondary | ICD-10-CM

## 2020-11-10 NOTE — Addendum Note (Signed)
Addended by: Octavio Manns E on: 11/10/2020 09:13 AM   Modules accepted: Orders

## 2020-11-11 LAB — IRON,TIBC AND FERRITIN PANEL
%SAT: 19 % (calc) (ref 16–45)
Ferritin: 64 ng/mL (ref 16–232)
Iron: 51 ug/dL (ref 45–160)
TIBC: 264 mcg/dL (calc) (ref 250–450)

## 2020-11-11 LAB — ZINC: Zinc: 62 ug/dL (ref 60–130)

## 2020-11-13 LAB — GI PROFILE, STOOL, PCR
Adenovirus F 40/41: NOT DETECTED
Astrovirus: NOT DETECTED
C difficile toxin A/B: DETECTED — AB
Campylobacter: NOT DETECTED
Cryptosporidium: DETECTED — AB
Cyclospora cayetanensis: NOT DETECTED
Entamoeba histolytica: NOT DETECTED
Enteroaggregative E coli: NOT DETECTED
Enteropathogenic E coli: DETECTED — AB
Enterotoxigenic E coli: NOT DETECTED
Giardia lamblia: NOT DETECTED
Norovirus GI/GII: NOT DETECTED
Plesiomonas shigelloides: NOT DETECTED
Rotavirus A: NOT DETECTED
Salmonella: NOT DETECTED
Sapovirus: NOT DETECTED
Shiga-toxin-producing E coli: NOT DETECTED
Shigella/Enteroinvasive E coli: NOT DETECTED
Vibrio cholerae: NOT DETECTED
Vibrio: NOT DETECTED
Yersinia enterocolitica: NOT DETECTED

## 2020-11-14 ENCOUNTER — Other Ambulatory Visit: Payer: Self-pay | Admitting: Family Medicine

## 2020-11-14 MED ORDER — AZITHROMYCIN 500 MG PO TABS: 1000.0000 mg | ORAL_TABLET | Freq: Once | ORAL | 0 refills | Status: AC

## 2020-11-14 MED ORDER — HYDROCODONE-ACETAMINOPHEN 5-325 MG PO TABS: 1.0000 | ORAL_TABLET | Freq: Four times a day (QID) | ORAL | 0 refills | Status: DC | PRN

## 2020-11-14 MED ORDER — VANCOMYCIN HCL 125 MG PO CAPS: 125.0000 mg | ORAL_CAPSULE | Freq: Four times a day (QID) | ORAL | 0 refills | Status: AC

## 2020-11-17 ENCOUNTER — Telehealth: Payer: Self-pay | Admitting: Gastroenterology

## 2020-11-17 NOTE — Telephone Encounter (Signed)
Inbound call from patient states she have 3 GI infections on being C. Diff. She is having bloating, diarrhea, abd pain, blood at first but not the last 2 days.

## 2020-11-17 NOTE — Telephone Encounter (Signed)
Pt states that she has recently been diagnosed with C Diff. Pt states that she is being treated with antibiotics prescribed by her PCP. Pt was encouraged to call us after her treatment of the 10 days of Vancomycin.  Pt also states that she was experiencing some leg and ankle swelling along with ankle pain. Pt was encouraged to reach out to her PCP in regard to her leg pain. Pt verbalized understanding with all questions answered.

## 2020-11-24 NOTE — Telephone Encounter (Signed)
Please see note and PIC from pt attached.  Pt states that she is still experiencing Abdominal Cramping and this is her last day of taking her antibiotics. Pt main concern is that she is still having abdominal cramping and concerned the way her stool looks. Pt states that her other symptoms are some swelling in bilateral legs; Joint pain; has a brace on left angle from Joint pain and is experiencing some cramps in her calves. Pt states that she had these symptoms prior to starting the antibiotics. Please advise

## 2020-11-25 ENCOUNTER — Other Ambulatory Visit: Payer: Self-pay | Admitting: Family Medicine

## 2020-11-25 MED ORDER — NITAZOXANIDE 500 MG PO TABS: 500.0000 mg | ORAL_TABLET | Freq: Two times a day (BID) | ORAL | 0 refills | Status: AC

## 2020-11-28 NOTE — Telephone Encounter (Signed)
Left message for pt to call back  °

## 2020-11-28 NOTE — Telephone Encounter (Signed)
Some diarrhea as expected still. For now keep well-hydrated. Will finish the antibiotics. If still with problems, will sought ID consultation. She does need follow-up appointment with me or in [facility] cilinic  RG

## 2020-12-01 NOTE — Telephone Encounter (Signed)
Patient returned call

## 2020-12-03 NOTE — Telephone Encounter (Signed)
Pt was diagnosed with Cdiff on 11/10/2020. Pt states that she was treated with Vancomycin and other and Norco. States that she finished antibiotics on 11/24/2020.    Pt states that she is having very bad abdominal cramps on both sides, Multiple loose stools( 7 yesterday) (12 last night)  pt states she has not slept in two nights due to the cramping and Multiple BM's,  Please advise

## 2020-12-04 ENCOUNTER — Telehealth: Payer: Self-pay | Admitting: Gastroenterology

## 2020-12-04 ENCOUNTER — Other Ambulatory Visit: Payer: Self-pay

## 2020-12-04 ENCOUNTER — Encounter: Payer: Self-pay | Admitting: Gastroenterology

## 2020-12-04 DIAGNOSIS — R109 Unspecified abdominal pain: Secondary | ICD-10-CM

## 2020-12-04 DIAGNOSIS — K582 Mixed irritable bowel syndrome: Secondary | ICD-10-CM

## 2020-12-04 NOTE — Telephone Encounter (Signed)
This is very strange.  Lets -Check CBC, CMP, CRP -Recheck stool for GI pathogens, calprotectin -Please just use Tylenol ES for now -Would proceed with x-ray KUB-2 view. -If she worsens, she needs to come over to ED  RG

## 2020-12-04 NOTE — Telephone Encounter (Signed)
Orders entered into Epic for labs and KUB 2 View Pt made aware and given detail instructions about collecting labs and KUB 2 View at [facility]. No appointment needed.  Pt made aware to use Tylenol Extra Strength along with if she worsens to go to the ED. Pt verbalized understanding with all questions answered.

## 2020-12-04 NOTE — Telephone Encounter (Signed)
Patient is still waiting on a response for her pain and what her recommendations are.  Please call patient and advise.  Thank you.

## 2020-12-04 NOTE — Addendum Note (Signed)
Addended by: Gillermina Hu on: 12/04/2020 04:52 PM   Modules accepted: Orders

## 2020-12-04 NOTE — Telephone Encounter (Signed)
Pt was called and notified of Dr. Lyndel Safe recommendations along with labs orders and KUB. Documented in separate note.

## 2020-12-05 ENCOUNTER — Ambulatory Visit (INDEPENDENT_AMBULATORY_CARE_PROVIDER_SITE_OTHER)
Admission: RE | Admit: 2020-12-05 | Discharge: 2020-12-05 | Disposition: A | Discharge status: Home / Self Care | Payer: Managed Care, Other (non HMO) | Source: Ambulatory Visit | Attending: Gastroenterology | Admitting: Gastroenterology

## 2020-12-05 ENCOUNTER — Other Ambulatory Visit: Payer: Self-pay

## 2020-12-05 ENCOUNTER — Other Ambulatory Visit (INDEPENDENT_AMBULATORY_CARE_PROVIDER_SITE_OTHER): Payer: Managed Care, Other (non HMO)

## 2020-12-05 DIAGNOSIS — K582 Mixed irritable bowel syndrome: Secondary | ICD-10-CM

## 2020-12-05 DIAGNOSIS — R109 Unspecified abdominal pain: Secondary | ICD-10-CM | POA: Diagnosis not present

## 2020-12-05 DIAGNOSIS — Z85038 Personal history of other malignant neoplasm of large intestine: Secondary | ICD-10-CM

## 2020-12-05 LAB — CBC WITH DIFFERENTIAL/PLATELET
Basophils Absolute: 0 10*3/uL (ref 0.0–0.1)
Basophils Relative: 0.8 % (ref 0.0–3.0)
Eosinophils Absolute: 0.2 10*3/uL (ref 0.0–0.7)
Eosinophils Relative: 6.8 % — ABNORMAL HIGH (ref 0.0–5.0)
HCT: 33.7 % — ABNORMAL LOW (ref 36.0–46.0)
Hemoglobin: 11.2 g/dL — ABNORMAL LOW (ref 12.0–15.0)
Lymphocytes Relative: 39.1 % (ref 12.0–46.0)
Lymphs Abs: 1.2 10*3/uL (ref 0.7–4.0)
MCHC: 33.4 g/dL (ref 30.0–36.0)
MCV: 89.7 fl (ref 78.0–100.0)
Monocytes Absolute: 0.3 10*3/uL (ref 0.1–1.0)
Monocytes Relative: 8 % (ref 3.0–12.0)
Neutro Abs: 1.4 10*3/uL (ref 1.4–7.7)
Neutrophils Relative %: 45.3 % (ref 43.0–77.0)
Platelets: 155 10*3/uL (ref 150.0–400.0)
RBC: 3.75 Mil/uL — ABNORMAL LOW (ref 3.87–5.11)
RDW: 13.1 % (ref 11.5–15.5)
WBC: 3.2 10*3/uL — ABNORMAL LOW (ref 4.0–10.5)

## 2020-12-05 LAB — COMPREHENSIVE METABOLIC PANEL
ALT: 13 U/L (ref 0–35)
AST: 19 U/L (ref 0–37)
Albumin: 4 g/dL (ref 3.5–5.2)
Alkaline Phosphatase: 49 U/L (ref 39–117)
BUN: 11 mg/dL (ref 6–23)
CO2: 32 mEq/L (ref 19–32)
Calcium: 9 mg/dL (ref 8.4–10.5)
Chloride: 99 mEq/L (ref 96–112)
Creatinine, Ser: 0.92 mg/dL (ref 0.40–1.20)
GFR: 72.01 mL/min (ref >60.00)
Glucose, Bld: 99 mg/dL (ref 70–99)
Potassium: 3.7 mEq/L (ref 3.5–5.1)
Sodium: 138 mEq/L (ref 135–145)
Total Bilirubin: 0.3 mg/dL (ref 0.2–1.2)
Total Protein: 7.1 g/dL (ref 6.0–8.3)

## 2020-12-05 LAB — C-REACTIVE PROTEIN: CRP: <1 mg/dL (ref 0.5–20.0)

## 2020-12-05 NOTE — Telephone Encounter (Signed)
Pt was called and given Dr. Lyndel Safe recommendations. Pt verbalized understanding with all questions answered.

## 2020-12-06 LAB — CEA: CEA: 3.7 ng/mL — ABNORMAL HIGH

## 2020-12-09 ENCOUNTER — Encounter: Payer: Self-pay | Admitting: Gastroenterology

## 2020-12-09 NOTE — Telephone Encounter (Signed)
Left message for pt to call Back

## 2020-12-09 NOTE — Telephone Encounter (Signed)
Pt states that  she is  a having a hard time keeping the urine out of the stool that's needs to be collected. Pt was notified that it is difficult sometimes and try to void first and then place the Collection container in the toilet. In regard to the swelling in the pt legs pt was encouraged to reach out to her PCP as this does not seem to be GI related. Pt reminded of appointment next week.  Pt verbalized understanding with all questions answered.

## 2020-12-17 ENCOUNTER — Ambulatory Visit (INDEPENDENT_AMBULATORY_CARE_PROVIDER_SITE_OTHER): Payer: Managed Care, Other (non HMO) | Admitting: Gastroenterology

## 2020-12-17 ENCOUNTER — Encounter: Payer: Self-pay | Admitting: Gastroenterology

## 2020-12-17 VITALS — BP 132/80 | HR 79 | Ht 68.0 in | Wt 144.5 lb

## 2020-12-17 DIAGNOSIS — Z85038 Personal history of other malignant neoplasm of large intestine: Secondary | ICD-10-CM

## 2020-12-17 DIAGNOSIS — K582 Mixed irritable bowel syndrome: Secondary | ICD-10-CM

## 2020-12-17 DIAGNOSIS — R1013 Epigastric pain: Secondary | ICD-10-CM

## 2020-12-17 DIAGNOSIS — K529 Noninfective gastroenteritis and colitis, unspecified: Secondary | ICD-10-CM | POA: Diagnosis not present

## 2020-12-17 DIAGNOSIS — R1011 Right upper quadrant pain: Secondary | ICD-10-CM

## 2020-12-17 MED ORDER — DICYCLOMINE HCL 10 MG PO CAPS: 10.0000 mg | ORAL_CAPSULE | Freq: Four times a day (QID) | ORAL | 3 refills | Status: DC

## 2020-12-17 NOTE — Progress Notes (Signed)
Chief Complaint: Longstanding abdominal complaints  Referring Provider:  Wendling, Nicholas [place];   #1. Recent gastroenteritis (interestingly had C. Difficile, EPEC and crypto-treated with vancomycin/azithromycin October 2022)  #2.  Underlying IBS-alt diarrhea/constipation/abdo pain. Nl TSH.   #2. H/O colon Ca 2005 s/p sigmoid resection. Last colon: neg 06/2018.  Mildly elevated CEA 3.7  #3. RUQ/epi pain/ pain. Neg EGD prev, s/p lap chole in past  Plan: -Rpt Stool for GI pathogen, C Diff PCR, calproctectin -3 meals/day -bentyl 10mg  po QID 1/2hr before meals and bedtime #120 -EGD/colon in end Jan 2023 with miralax -Hold off on PPIs at this time -Appt with Dr Nani Ravens (RE: ?  Fibromyalgia/leg swelling).  Patient's mom will make the appt.   HPI:    Michelle Potts is a 50 y.o. female  With H/O anxiety, under considerable stress-husband has been diagnosed as having stage IV non-small cell lung cancer (undergoing immunotherapy), personal history of colon cancer s/p sigmoid resection.  Last colonoscopy at [facility] 06/2018: neg with negative random colonic biopsies.  Anastomosis at 30 cm-patent.   Took antibiotics for sinus infection in October 2022.  She also has been eating a lot of vegetables.  Had severe diarrhea.  GI path was positive for C. difficile, EPEC and Cryptosporidium.  She was treated with vancomycin and short course of azithromycin.  She has felt better.  Her most recent CMP, CRP was normal.  CBC is at baseline.  Her white cell count is 3.2 which is at baseline.  She does feel better from GI standpoint.  Back to baseline with alternating diarrhea and constipation.  No nocturnal symptoms.  Continues to have postprandial epigastric discomfort, chronic right upper quadrant abdominal pain and L mid quadrant abdominal pain.  Denies having any significant upper GI symptoms including nausea, vomiting, heartburn, regurgitation, odynophagia or  dysphagia.  Wt Readings from Last 3 Encounters:  12/17/20 144 lb 8 oz (65.5 kg)  11/07/20 145 lb (65.8 kg)  09/29/20 154 lb (69.9 kg)      Past GI procedures (at [facility]) Colonoscopy 06/2018: neg, neg random colonic biopsies.  Anastomosis at 30 cm.  Colonoscopy 06/2014: neg EGD 04/2013: Normal.  Negative duodenal biopsies for celiac.  CT AP 03/2020 with contrast 1. No specific abnormality to explain the patient's symptoms. 2.  Aortic Atherosclerosis (ICD10-I70.0). 3. Mild bilateral foraminal impingement at L5-S1.  Past Medical History:  Diagnosis Date   Colorectal cancer ([place]) 2005   IBS (irritable bowel syndrome)     Past Surgical History:  Procedure Laterality Date   ABDOMINAL HYSTERECTOMY     APPENDECTOMY     BOWEL RESECTION     CHOLECYSTECTOMY     COLONOSCOPY  06/24/2014   [facility] Everywhere   ESOPHAGOGASTRODUODENOSCOPY  05/17/2013   [facility] Everywhere    Family History  Problem Relation Age of Onset   Stroke Father     Social History   Tobacco Use   Smoking status: Former    Types: Cigarettes   Smokeless tobacco: Never  Vaping Use   Vaping Use: Never used  Substance Use Topics   Alcohol use: Yes    Comment: socially   Drug use: No    Current Outpatient Medications  Medication Sig Dispense Refill   clonazePAM (KLONOPIN) 1 MG tablet Take 0.5-1 tablets (0.5-1 mg total) by mouth 2 (two) times daily as needed for anxiety. 60 tablet 1   nortriptyline (PAMELOR) 50 MG capsule  Take 1 capsule (50 mg total) by mouth at bedtime. 30 capsule 3   SYNTHROID 100 MCG tablet Take 1 tablet (100 mcg total) by mouth daily before breakfast. 30 tablet 2   triamcinolone ointment (KENALOG) 0.5 % Apply 1 application topically 2 (two) times daily. 30 g 0   venlafaxine XR (EFFEXOR XR) 37.5 MG 24 hr capsule Take 1 capsule (37.5 mg total) by mouth daily with breakfast. 30 capsule 2   HYDROcodone-acetaminophen (NORCO) 5-325 MG tablet Take 1 tablet  by mouth every 6 (six) hours as needed for moderate pain. (Patient not taking: Reported on 12/17/2020) 15 tablet 0   No current facility-administered medications for this visit.    Allergies  Allergen Reactions   Codeine Nausea And Vomiting   Iodinated Diagnostic Agents Itching    Itching immediately after contrast injection. Treated with Benadryl onsite. Must be pre-medicated prior to receiving any Iodinated contrast media in the future.    Review of Systems:  Psychiatric/Behavioral: Has anxiety or depression     Physical Exam:    BP 132/80 (BP Location: Left Arm, Patient Position: Sitting, Cuff Size: Normal)   Pulse 79   Ht 5\' 8"  (1.727 m)   Wt 144 lb 8 oz (65.5 kg)   SpO2 99%   BMI 21.97 kg/m  Wt Readings from Last 3 Encounters:  12/17/20 144 lb 8 oz (65.5 kg)  11/07/20 145 lb (65.8 kg)  09/29/20 154 lb (69.9 kg)   Constitutional:  Well-developed, in no acute distress. Psychiatric: Normal mood and affect. Behavior is normal. HEENT: Pupils normal.  Conjunctivae are normal. No scleral icterus.  Cardiovascular: Normal rate, regular rhythm. No edema Pulmonary/chest: Effort normal and breath sounds normal. No wheezing, rales or rhonchi. Abdominal: Soft, nondistended. Nontender.  Some muscle wall tenderness.  Bowel sounds active throughout. There are no masses palpable. No hepatomegaly. Rectal: Deferred Neurological: Alert and oriented to person place and time. Skin: Skin is warm and dry. No rashes noted.    Carmell Austria, MD 12/17/2020, 8:34 AM  Cc: Shelda Pal*

## 2020-12-17 NOTE — Patient Instructions (Addendum)
If you are age 51 or older, your body mass index should be between 23-30. Your Body mass index is 21.97 kg/m. If this is out of the aforementioned range listed, please consider follow up with your Primary Care Provider.  If you are age 16 or younger, your body mass index should be between 19-25. Your Body mass index is 21.97 kg/m. If this is out of the aformentioned range listed, please consider follow up with your Primary Care Provider.   ________________________________________________________  The [place] [facility] providers would like to encourage you to use [facility] to communicate with providers for non-urgent requests or questions.  Due to long hold times on the telephone, sending your provider a message by [facility] may be a faster and more efficient way to get a response.  Please allow 48 business hours for a response.  Please remember that this is for non-urgent requests.  _______________________________________________________  Michelle Potts have been scheduled for an endoscopy and colonoscopy. Please follow the written instructions given to you at your visit today. Please pick up your prep supplies at the pharmacy within the next 1-3 days. If you use inhalers (even only as needed), please bring them with you on the day of your procedure.  Please go to the lab on the 2nd floor [address] before you leave the office today.   We have sent the following medications to your pharmacy for you to pick up at your convenience: Bentyl  Try to eat 3 meals a day  Please make appt with PCP  Please call with any questions or concerns.  Thank you,  Dr. Jackquline Denmark

## 2020-12-18 ENCOUNTER — Telehealth: Payer: Self-pay | Admitting: Gastroenterology

## 2020-12-18 NOTE — Telephone Encounter (Signed)
Did a verbal with [facility] for 20mg  tablet to take 0.5 tablet 4 times daily with 60 tablets dispense. Tried to call patient regarding this. My chart message sent

## 2020-12-18 NOTE — Telephone Encounter (Signed)
Inbound call from [employer].. States dicyclomine 10mg  is on back order. Is there an alternative?

## 2020-12-19 ENCOUNTER — Other Ambulatory Visit: Payer: Managed Care, Other (non HMO)

## 2020-12-19 DIAGNOSIS — R1011 Right upper quadrant pain: Secondary | ICD-10-CM

## 2020-12-19 DIAGNOSIS — K529 Noninfective gastroenteritis and colitis, unspecified: Secondary | ICD-10-CM

## 2020-12-19 DIAGNOSIS — K582 Mixed irritable bowel syndrome: Secondary | ICD-10-CM

## 2020-12-19 DIAGNOSIS — R1013 Epigastric pain: Secondary | ICD-10-CM

## 2020-12-19 DIAGNOSIS — Z85038 Personal history of other malignant neoplasm of large intestine: Secondary | ICD-10-CM

## 2020-12-21 LAB — CLOSTRIDIUM DIFFICILE TOXIN B, QUALITATIVE, REAL-TIME PCR: Toxigenic C. Difficile by PCR: DETECTED — AB

## 2020-12-21 NOTE — Progress Notes (Signed)
Please inform the patient. She was recently treated with vancomycin. C. difficile may remain positive for several weeks (even when successfully treated the first time). Plan: -Repeat PO vancomycin 125 4 times daily x 14 days ONLY if having diarrhea (over 3 loose stools/day or nocturnal symptoms) -Will follow rest of the stool test results. Send report to family physician

## 2020-12-22 ENCOUNTER — Other Ambulatory Visit: Payer: Self-pay

## 2020-12-22 ENCOUNTER — Ambulatory Visit (INDEPENDENT_AMBULATORY_CARE_PROVIDER_SITE_OTHER): Payer: Managed Care, Other (non HMO) | Admitting: Family Medicine

## 2020-12-22 ENCOUNTER — Encounter: Payer: Self-pay | Admitting: Family Medicine

## 2020-12-22 VITALS — BP 116/70 | HR 79 | Temp 98.9°F | Ht 68.0 in | Wt 142.2 lb

## 2020-12-22 DIAGNOSIS — R053 Chronic cough: Secondary | ICD-10-CM

## 2020-12-22 DIAGNOSIS — F411 Generalized anxiety disorder: Secondary | ICD-10-CM

## 2020-12-22 DIAGNOSIS — N6311 Unspecified lump in the right breast, upper outer quadrant: Secondary | ICD-10-CM

## 2020-12-22 DIAGNOSIS — M7989 Other specified soft tissue disorders: Secondary | ICD-10-CM | POA: Diagnosis not present

## 2020-12-22 DIAGNOSIS — A0472 Enterocolitis due to Clostridium difficile, not specified as recurrent: Secondary | ICD-10-CM | POA: Diagnosis not present

## 2020-12-22 DIAGNOSIS — R7989 Other specified abnormal findings of blood chemistry: Secondary | ICD-10-CM

## 2020-12-22 DIAGNOSIS — E559 Vitamin D deficiency, unspecified: Secondary | ICD-10-CM | POA: Diagnosis not present

## 2020-12-22 LAB — COMPREHENSIVE METABOLIC PANEL
ALT: 8 U/L (ref 0–35)
AST: 14 U/L (ref 0–37)
Albumin: 4 g/dL (ref 3.5–5.2)
Alkaline Phosphatase: 49 U/L (ref 39–117)
BUN: 15 mg/dL (ref 6–23)
CO2: 33 mEq/L — ABNORMAL HIGH (ref 19–32)
Calcium: 9.2 mg/dL (ref 8.4–10.5)
Chloride: 99 mEq/L (ref 96–112)
Creatinine, Ser: 0.91 mg/dL (ref 0.40–1.20)
GFR: 72.94 mL/min (ref >60.00)
Glucose, Bld: 87 mg/dL (ref 70–99)
Potassium: 4.1 mEq/L (ref 3.5–5.1)
Sodium: 138 mEq/L (ref 135–145)
Total Bilirubin: 0.2 mg/dL (ref 0.2–1.2)
Total Protein: 7 g/dL (ref 6.0–8.3)

## 2020-12-22 LAB — CALPROTECTIN, FECAL: Calprotectin, Fecal: <16 ug/g (ref 0–120)

## 2020-12-22 LAB — VITAMIN D 25 HYDROXY (VIT D DEFICIENCY, FRACTURES): VITD: 20.98 ng/mL — ABNORMAL LOW (ref 30.00–100.00)

## 2020-12-22 LAB — TSH: TSH: 4.44 u[IU]/mL (ref 0.35–5.50)

## 2020-12-22 LAB — T4, FREE: Free T4: 0.75 ng/dL (ref 0.60–1.60)

## 2020-12-22 MED ORDER — VENLAFAXINE HCL ER 75 MG PO CP24: 75.0000 mg | ORAL_CAPSULE | Freq: Every day | ORAL | 2 refills | Status: DC

## 2020-12-22 MED ORDER — VANCOMYCIN HCL 125 MG PO CAPS: 125.0000 mg | ORAL_CAPSULE | Freq: Four times a day (QID) | ORAL | 0 refills | Status: AC

## 2020-12-22 MED ORDER — NORTRIPTYLINE HCL 25 MG PO CAPS: 25.0000 mg | ORAL_CAPSULE | Freq: Every day | ORAL | 0 refills | Status: DC

## 2020-12-22 MED ORDER — CLONAZEPAM 1 MG PO TABS: 0.5000 mg | ORAL_TABLET | Freq: Two times a day (BID) | ORAL | 1 refills | Status: DC | PRN

## 2020-12-22 NOTE — Patient Instructions (Addendum)
Give [place] 2-3 business days to get the results of your labs back.   For the swelling in your lower extremities, be sure to elevate your legs when able, mind the salt intake, stay physically active and consider wearing compression stockings.  Aim to do some physical exertion for 150 minutes per week. This is typically divided into 5 days per week, 30 minutes per day. The activity should be enough to get your heart rate up. Anything is better than nothing if you have time constraints.  [email or web address]  Let us know if you need anything.

## 2020-12-22 NOTE — Progress Notes (Signed)
Chief Complaint  Patient presents with   Edema    Feet and ankles swelling Check lab work today    Ferd Glassing here for bilateral leg swelling. Here w spouse.   Duration: 2 months, getting worse Hx of prolonged bedrest, recent surgery, travel or injury? No Pain the calf? Yes SOB?  Some shortness of breath, she also has an associated cough that has been going on for several months.  She stopped smoking 7 days ago. Personal or family history of clot or bleeding disorder? No Hx of heart failure, renal failure, hepatic failure? No  Anxiety The patient has strong anxiety.  She takes Effexor 37.5 mg daily and was recently started on Pamelor 50 mg daily to help with abdominal issues.  She has not noticed much improvement but is still dealing with a C. difficile infection.  She was recently placed on oral vancomycin for 14 days.  She is not following with a counselor or psychologist.  Her husband going through cancer treatment/scans provide significant anxiety for her.  She has associated diffuse pain.  Breast Pt was getting routine mammogram done and felt a lump in her R UO quad. No pain, drainage, redness, dimpling, nipple dc, hx of breast cancer. She does have a hx of colon cancer.   Past Medical History:  Diagnosis Date   Colorectal cancer ([place]) 2005   IBS (irritable bowel syndrome)    Family History  Problem Relation Age of Onset   Stroke Father    Past Surgical History:  Procedure Laterality Date   ABDOMINAL HYSTERECTOMY     APPENDECTOMY     BOWEL RESECTION     CHOLECYSTECTOMY     COLONOSCOPY  06/24/2014   [facility] Everywhere   ESOPHAGOGASTRODUODENOSCOPY  05/17/2013   [facility] Everywhere    Current Outpatient Medications:    dicyclomine (BENTYL) 10 MG capsule, Take 1 capsule (10 mg total) by mouth in the morning, at noon, in the evening, and at bedtime. Take 30 minutes before each meal, Disp: 120 capsule, Rfl: 3   nortriptyline (PAMELOR) 25 MG capsule,  Take 1 capsule (25 mg total) by mouth at bedtime., Disp: 30 capsule, Rfl: 0   SYNTHROID 100 MCG tablet, Take 1 tablet (100 mcg total) by mouth daily before breakfast., Disp: 30 tablet, Rfl: 2   triamcinolone ointment (KENALOG) 0.5 %, Apply 1 application topically 2 (two) times daily., Disp: 30 g, Rfl: 0   vancomycin (VANCOCIN) 125 MG capsule, Take 1 capsule (125 mg total) by mouth 4 (four) times daily for 14 days., Disp: 56 capsule, Rfl: 0   venlafaxine XR (EFFEXOR XR) 75 MG 24 hr capsule, Take 1 capsule (75 mg total) by mouth daily with breakfast., Disp: 30 capsule, Rfl: 2   clonazePAM (KLONOPIN) 1 MG tablet, Take 0.5-2 tablets (0.5-2 mg total) by mouth 2 (two) times daily as needed for anxiety., Disp: 60 tablet, Rfl: 1  BP 116/70   Pulse 79   Temp 98.9 F (37.2 C) (Oral)   Ht 5\' 8"  (1.727 m)   Wt 142 lb 4 oz (64.5 kg)   SpO2 99%   BMI 21.63 kg/m  Gen- awake, alert, appears stated age Heart- RRR, no gallops or rubs, +LE edema at this time Skin: Over the axillary tail/right upper outer quadrant of the breast there is a slightly hard and freely movable nodule measuring approximately 1 cm x 0.6 cm.  There is no fluctuance or tenderness.  I do not appreciate any drainage. Lungs- CTAB,  normal effort w/o accessory muscle use MSK- +bl calf pain; +ttp over hamstrings, back muscle b/l from C spine to sacrum Psych: Age appropriate judgment and insight  Localized swelling of both lower extremities - Plan: TSH, T4, free, Comprehensive metabolic panel  Low vitamin D level - Plan: VITAMIN D 25 Hydroxy (Vit-D Deficiency, Fractures)  Mass of upper outer quadrant of right breast - Plan: MM Digital Diagnostic Unilat R, US BREAST COMPLETE UNI RIGHT INC AXILLA  C. difficile colitis  Chronic cough - Plan: DG Chest 2 View  GAD (generalized anxiety disorder)  New problem, uncertain prog at this. Ck labs. Elevate legs, compression, physical activity, mind salt intake. Ck Vit D. Ck [place] and mammogram.  C  diff infection; tx'd by GI w qid PO Vanco for 2 weeks.  With associated cough, will check a chest x-ray given her husband's history of lung cancer and her smoking history. We will start decreasing the Pamelor to 25 mg/d and increasing the Effexor to 75 mg/d.  Okay to take 1.5-2 mg of Klonopin as needed if she is using it sparsely. F/u in 1 mo to recheck anxiety. Pt voiced understanding and agreement to the plan.  I spent 60 min with the patient and her spouse discussing the above plan in addition to reviewing her chart on the same day of the visit.   [place], DO 12/22/20  4:43 PM

## 2020-12-23 ENCOUNTER — Other Ambulatory Visit: Payer: Self-pay | Admitting: Family Medicine

## 2020-12-23 ENCOUNTER — Telehealth: Payer: Self-pay

## 2020-12-23 DIAGNOSIS — R7989 Other specified abnormal findings of blood chemistry: Secondary | ICD-10-CM

## 2020-12-23 MED ORDER — VITAMIN D (ERGOCALCIFEROL) 1.25 MG (50000 UNIT) PO CAPS: 50000.0000 [IU] | ORAL_CAPSULE | ORAL | 0 refills | Status: DC

## 2020-12-23 NOTE — Telephone Encounter (Signed)
Michelle Potts has taken care of it Vancomycin has been called in RG

## 2020-12-23 NOTE — Telephone Encounter (Signed)
Noted  

## 2020-12-23 NOTE — Telephone Encounter (Signed)
Print out from [place] on this patient says she her C.diff came back positive. Please advise

## 2020-12-24 ENCOUNTER — Encounter: Payer: Self-pay | Admitting: Family Medicine

## 2020-12-24 LAB — GI PROFILE, STOOL, PCR

## 2021-01-14 ENCOUNTER — Encounter: Payer: Self-pay | Admitting: Gastroenterology

## 2021-01-14 NOTE — Telephone Encounter (Signed)
Pt complaining of nausea and change in Bowel Habits. Pt was scheduled an office visit for 01/15/2021 @ 11:20 with Dr. Lyndel Safe in [facility]. Pt made aware.  Pt verbalized understanding with all questions answered.

## 2021-01-15 ENCOUNTER — Ambulatory Visit (INDEPENDENT_AMBULATORY_CARE_PROVIDER_SITE_OTHER): Payer: Managed Care, Other (non HMO) | Admitting: Gastroenterology

## 2021-01-15 ENCOUNTER — Other Ambulatory Visit: Payer: Self-pay

## 2021-01-15 ENCOUNTER — Ambulatory Visit (HOSPITAL_BASED_OUTPATIENT_CLINIC_OR_DEPARTMENT_OTHER)
Admission: RE | Admit: 2021-01-15 | Discharge: 2021-01-15 | Disposition: A | Discharge status: Home / Self Care | Payer: Managed Care, Other (non HMO) | Source: Ambulatory Visit | Attending: Family Medicine | Admitting: Family Medicine

## 2021-01-15 VITALS — BP 124/82 | HR 81 | Ht 68.5 in | Wt 137.5 lb

## 2021-01-15 DIAGNOSIS — R053 Chronic cough: Secondary | ICD-10-CM | POA: Diagnosis not present

## 2021-01-15 DIAGNOSIS — K582 Mixed irritable bowel syndrome: Secondary | ICD-10-CM

## 2021-01-15 DIAGNOSIS — K529 Noninfective gastroenteritis and colitis, unspecified: Secondary | ICD-10-CM | POA: Diagnosis not present

## 2021-01-15 MED ORDER — DICYCLOMINE HCL 20 MG PO TABS: 20.0000 mg | ORAL_TABLET | Freq: Two times a day (BID) | ORAL | 4 refills | Status: DC

## 2021-01-15 NOTE — Patient Instructions (Signed)
If you are age 51 or older, your body mass index should be between 23-30. Your Body mass index is 20.6 kg/m. If this is out of the aforementioned range listed, please consider follow up with your Primary Care Provider.  If you are age 91 or younger, your body mass index should be between 19-25. Your Body mass index is 20.6 kg/m. If this is out of the aformentioned range listed, please consider follow up with your Primary Care Provider.   ________________________________________________________  The [place] [facility] providers would like to encourage you to use [facility] to communicate with providers for non-urgent requests or questions.  Due to long hold times on the telephone, sending your provider a message by [facility] may be a faster and more efficient way to get a response.  Please allow 48 business hours for a response.  Please remember that this is for non-urgent requests.  _______________________________________________________  We have sent the following medications to your pharmacy for you to pick up at your convenience: Bentyl  Please keep instructions for EGD/Colon and appointment.  Please call with any questions or concerns.  Thank you,  Dr. Jackquline Denmark

## 2021-01-15 NOTE — Progress Notes (Signed)
Chief Complaint: Longstanding abdominal complaints  Referring Provider:  Wendling, Nicholas [place];   #1. Recent gastroenteritis (interestingly had C. Difficile, EPEC and crypto-treated with vancomycin/azithromycin October 2022, repeat stool study was positive for C. difficile toxin, treated with second course of vancomycin)  #2. Underlying IBS-alt diarrhea/constipation/abdo pain. Nl TSH.   #2. H/O colon Ca 2005 s/p sigmoid resection. Last colon: neg 06/2018.  Mildly elevated CEA 3.7  #3. RUQ/epi pain/ pain. Neg EGD prev, s/p lap chole in past  Plan: -Meals 4-6/day. -bentyl 20mg  po QID 1/2hr before meals and bedtime #120 -Ensure 1/day -EGD/colon with MiraLAX prep -Hold off on PPIs at this time -D/W pt and pts mom   I discussed EGD/Colonoscopy- the indications, risks, alternatives and potential complications including, but not limited to, bleeding, infection, reaction to medication, damage to internal organs, cardiac and/or pulmonary problems, and perforation requiring surgery. The possibility that significant findings could be missed was explained. All ? were answered. The patient gives consent to proceed.  HPI:    Michelle Potts is a 50 y.o. female  With H/O anxiety, under considerable stress-husband has been diagnosed as having stage IV non-small cell lung cancer (undergoing immunotherapy), personal history of colon cancer s/p sigmoid resection.  Last colonoscopy at [facility] 06/2018: neg with negative random colonic biopsies.  Anastomosis at 30 cm-patent.   Took antibiotics for sinus infection in October 2022.  She also has been eating a lot of vegetables.  Had severe diarrhea.  GI path was positive for C. difficile, EPEC and Cryptosporidium.  She was treated with vancomycin and short course of azithromycin.  She again had diarrhea.  Stool studies were positive for C. difficile toxin prompting second course of vancomycin.  Her diarrhea has resolved.   She brings in pictures which are scanned in which showed solid stool.  She has been eating better.  Most recent stool studies including stool for calprotectin was normal.  Her most recent CMP, CRP was normal.  CBC is at baseline.  Her white cell count is 3.2 which is at baseline.  She does feel better from GI standpoint.  Back to baseline with alternating diarrhea and constipation.  No nocturnal symptoms.  Continues to have postprandial epigastric discomfort, chronic right upper quadrant abdominal pain and L mid quadrant abdominal pain.  Denies having any significant upper GI symptoms including nausea, vomiting, heartburn, regurgitation, odynophagia or dysphagia.  Wt Readings from Last 3 Encounters:  01/15/21 137 lb 8 oz (62.4 kg)  12/22/20 142 lb 4 oz (64.5 kg)  12/17/20 144 lb 8 oz (65.5 kg)      Past GI procedures (at [facility]) Colonoscopy 06/2018: neg, neg random colonic biopsies.  Anastomosis at 30 cm.  Colonoscopy 06/2014: neg EGD 04/2013: Normal.  Negative duodenal biopsies for celiac.  CT AP 03/2020 with contrast 1. No specific abnormality to explain the patient's symptoms. 2.  Aortic Atherosclerosis (ICD10-I70.0). 3. Mild bilateral foraminal impingement at L5-S1.  Past Medical History:  Diagnosis Date   Colorectal cancer ([place]) 2005   IBS (irritable bowel syndrome)     Past Surgical History:  Procedure Laterality Date   ABDOMINAL HYSTERECTOMY     APPENDECTOMY     BOWEL RESECTION     CHOLECYSTECTOMY     COLONOSCOPY  06/24/2014   [facility] Everywhere   ESOPHAGOGASTRODUODENOSCOPY  05/17/2013   [facility] Everywhere    Family History  Problem Relation Age of Onset  Stroke Father     Social History   Tobacco Use   Smoking status: Former    Types: Cigarettes   Smokeless tobacco: Never  Vaping Use   Vaping Use: Never used  Substance Use Topics   Alcohol use: Yes    Comment: socially   Drug use: No    Current Outpatient  Medications  Medication Sig Dispense Refill   clonazePAM (KLONOPIN) 1 MG tablet Take 0.5-2 tablets (0.5-2 mg total) by mouth 2 (two) times daily as needed for anxiety. 60 tablet 1   dicyclomine (BENTYL) 10 MG capsule Take 1 capsule (10 mg total) by mouth in the morning, at noon, in the evening, and at bedtime. Take 30 minutes before each meal 120 capsule 3   nortriptyline (PAMELOR) 25 MG capsule Take 1 capsule (25 mg total) by mouth at bedtime. 30 capsule 0   SYNTHROID 100 MCG tablet Take 1 tablet (100 mcg total) by mouth daily before breakfast. 30 tablet 2   venlafaxine XR (EFFEXOR XR) 75 MG 24 hr capsule Take 1 capsule (75 mg total) by mouth daily with breakfast. 30 capsule 2   Vitamin D, Ergocalciferol, (DRISDOL) 1.25 MG (50000 UNIT) CAPS capsule Take 1 capsule (50,000 Units total) by mouth every 7 (seven) days. 12 capsule 0   No current facility-administered medications for this visit.    Allergies  Allergen Reactions   Codeine Nausea And Vomiting   Iodinated Contrast Media Itching    Itching immediately after contrast injection. Treated with Benadryl onsite. Must be pre-medicated prior to receiving any Iodinated contrast media in the future.    Review of Systems:  Psychiatric/Behavioral: Has anxiety or depression     Physical Exam:    BP 124/82    Pulse 81    Ht 5' 8.5" (1.74 m)    Wt 137 lb 8 oz (62.4 kg)    BMI 20.60 kg/m  Wt Readings from Last 3 Encounters:  01/15/21 137 lb 8 oz (62.4 kg)  12/22/20 142 lb 4 oz (64.5 kg)  12/17/20 144 lb 8 oz (65.5 kg)   Constitutional:  Well-developed, in no acute distress. Psychiatric: Normal mood and affect. Behavior is normal. HEENT: Pupils normal.  Conjunctivae are normal. No scleral icterus.  Cardiovascular: Normal rate, regular rhythm. No edema Pulmonary/chest: Effort normal and breath sounds normal. No wheezing, rales or rhonchi. Abdominal: Soft, nondistended. Nontender.  Some muscle wall tenderness.  Bowel sounds active  throughout. There are no masses palpable. No hepatomegaly. Rectal: Deferred Neurological: Alert and oriented to person place and time. Skin: Skin is warm and dry. No rashes noted.    Carmell Austria, MD 01/15/2021, 11:31 AM  Cc: Shelda Pal*

## 2021-02-05 ENCOUNTER — Encounter: Payer: Self-pay | Admitting: Family Medicine

## 2021-02-05 DIAGNOSIS — Z0189 Encounter for other specified special examinations: Secondary | ICD-10-CM

## 2021-02-06 ENCOUNTER — Ambulatory Visit
Admission: RE | Admit: 2021-02-06 | Discharge: 2021-02-06 | Disposition: A | Discharge status: Home / Self Care | Payer: Managed Care, Other (non HMO) | Source: Ambulatory Visit | Attending: Family Medicine | Admitting: Family Medicine

## 2021-02-06 ENCOUNTER — Other Ambulatory Visit: Payer: Self-pay

## 2021-02-06 DIAGNOSIS — N6311 Unspecified lump in the right breast, upper outer quadrant: Secondary | ICD-10-CM

## 2021-02-10 ENCOUNTER — Ambulatory Visit: Payer: Managed Care, Other (non HMO) | Admitting: Family Medicine

## 2021-02-11 ENCOUNTER — Telehealth: Payer: Self-pay | Admitting: Gastroenterology

## 2021-02-11 NOTE — Telephone Encounter (Signed)
They are not going to approve EGD at this time Lets just proceed with colonoscopy RG

## 2021-02-11 NOTE — Telephone Encounter (Signed)
Case was denied (EGD) by [employer] for needing one of the following first: Failed 4 week trial of treatment with proton pump inhibitor therapy, parent sibling or child has a history of cancer in their upper gastro or ugi tract, this includes the esophagus, stomach and duodenum, unintended weight loss more than 5% in the past 6-12 months, anorexia, suspected ug bleeding based on blood tests exam and or history, iron deficiency anemia that is suspected to have started in the ugi tract, dysphagia, chest pain when swallowing, vomiting for at least 7 days for unknown cause, abnormal prior ugi imaging suggesting organic disease, suspected cancer as evidence as history or exam, a mass in the abdomen or lymph nodes that can be felt by provider.  Please advise if a peer to peer is needed.

## 2021-02-11 NOTE — Telephone Encounter (Signed)
Can we see about the peer to peer? Can you send the info Michelle Potts just in case.  Dr Lyndel Safe this patient has been scheduled since November and they denied her EGD are you willing to do a peer to peer? Her procedure is scheduled 2-1

## 2021-02-12 NOTE — Telephone Encounter (Signed)
LVM for patient to call back.  We have change patient EGD/Colon to just colon and time etc are the same

## 2021-02-12 NOTE — Telephone Encounter (Signed)
Tried to call pt but no one picked up the first time. Tried again and left a voicemail

## 2021-02-16 ENCOUNTER — Ambulatory Visit: Payer: Managed Care, Other (non HMO) | Admitting: Family Medicine

## 2021-02-18 ENCOUNTER — Other Ambulatory Visit: Payer: Self-pay

## 2021-02-18 ENCOUNTER — Encounter: Payer: Self-pay | Admitting: Gastroenterology

## 2021-02-18 ENCOUNTER — Ambulatory Visit (AMBULATORY_SURGERY_CENTER): Payer: Managed Care, Other (non HMO) | Admitting: Gastroenterology

## 2021-02-18 VITALS — BP 120/72 | HR 76 | Temp 96.4°F | Resp 14 | Ht 68.0 in | Wt 137.0 lb

## 2021-02-18 DIAGNOSIS — K582 Mixed irritable bowel syndrome: Secondary | ICD-10-CM

## 2021-02-18 DIAGNOSIS — K640 First degree hemorrhoids: Secondary | ICD-10-CM | POA: Diagnosis not present

## 2021-02-18 DIAGNOSIS — R109 Unspecified abdominal pain: Secondary | ICD-10-CM

## 2021-02-18 DIAGNOSIS — K529 Noninfective gastroenteritis and colitis, unspecified: Secondary | ICD-10-CM

## 2021-02-18 DIAGNOSIS — R1011 Right upper quadrant pain: Secondary | ICD-10-CM

## 2021-02-18 DIAGNOSIS — D123 Benign neoplasm of transverse colon: Secondary | ICD-10-CM

## 2021-02-18 DIAGNOSIS — D124 Benign neoplasm of descending colon: Secondary | ICD-10-CM

## 2021-02-18 DIAGNOSIS — Z85038 Personal history of other malignant neoplasm of large intestine: Secondary | ICD-10-CM

## 2021-02-18 DIAGNOSIS — K5730 Diverticulosis of large intestine without perforation or abscess without bleeding: Secondary | ICD-10-CM | POA: Diagnosis not present

## 2021-02-18 MED ORDER — SODIUM CHLORIDE 0.9 % IV SOLN: 500.0000 mL | INTRAVENOUS | Status: DC

## 2021-02-18 NOTE — Progress Notes (Signed)
Called to room to assist during endoscopic procedure.  Patient ID and intended procedure confirmed with present staff. Received instructions for my participation in the procedure from the performing physician.  

## 2021-02-18 NOTE — Patient Instructions (Signed)
HANDOUT ON POLYPS & HEMORRHOIDS  GIVEN TO YOU TODAY  AWAIT PATHOLOGY RESULTS ON POLYPS REMOVED AND ON BIOPSIES DONE OF COLON  FOLLOW UP WITH AN OFFICE APPOINTMENT WITH DR GUPTA IN 12 WEEKS - MAKE THIS APPOINTMENT - DR GUPTA'S OFFICE MAY CALL YOU TO SET THIS UP    YOU HAD AN ENDOSCOPIC PROCEDURE TODAY AT [facility]:   Refer to the procedure report that was given to you for any specific questions about what was found during the examination.  If the procedure report does not answer your questions, please call your gastroenterologist to clarify.  If you requested that your care partner not be given the details of your procedure findings, then the procedure report has been included in a sealed envelope for you to review at your convenience later.  YOU SHOULD EXPECT: Some feelings of bloating in the abdomen. Passage of more gas than usual.  Walking can help get rid of the air that was put into your GI tract during the procedure and reduce the bloating. If you had a lower endoscopy (such as a colonoscopy or flexible sigmoidoscopy) you may notice spotting of blood in your stool or on the toilet paper. If you underwent a bowel prep for your procedure, you may not have a normal bowel movement for a few days.  Please Note:  You might notice some irritation and congestion in your nose or some drainage.  This is from the oxygen used during your procedure.  There is no need for concern and it should clear up in a day or so.  SYMPTOMS TO REPORT IMMEDIATELY:  Following lower endoscopy (colonoscopy or flexible sigmoidoscopy):  Excessive amounts of blood in the stool  Significant tenderness or worsening of abdominal pains  Swelling of the abdomen that is new, acute  Fever of 100F or higher   For urgent or emergent issues, a gastroenterologist can be reached at any hour by calling [phone]. Do not use MyChart messaging for urgent concerns.    DIET:  We do recommend a small meal  at first, but then you may proceed to your regular diet.  Drink plenty of fluids but you should avoid alcoholic beverages for 24 hours.  ACTIVITY:  You should plan to take it easy for the rest of today and you should NOT DRIVE or use heavy machinery until tomorrow (because of the sedation medicines used during the test).    FOLLOW UP: Our staff will call the number listed on your records 48-72 hours following your procedure to check on you and address any questions or concerns that you may have regarding the information given to you following your procedure. If we do not reach you, we will leave a message.  We will attempt to reach you two times.  During this call, we will ask if you have developed any symptoms of COVID 19. If you develop any symptoms (ie: fever, flu-like symptoms, shortness of breath, cough etc.) before then, please call [phone].  If you test positive for Covid 19 in the 2 weeks post procedure, please call and report this information to [place].    If any biopsies were taken you will be contacted by phone or by letter within the next 1-3 weeks.  Please call us at [phone] if you have not heard about the biopsies in 3 weeks.    SIGNATURES/CONFIDENTIALITY: You and/or your care partner have signed paperwork which will be entered into your electronic medical record.  These signatures  attest to the fact that that the information above on your After Visit Summary has been reviewed and is understood.  Full responsibility of the confidentiality of this discharge information lies with you and/or your care-partner.

## 2021-02-18 NOTE — Progress Notes (Signed)
Chief Complaint: Longstanding abdominal complaints  Referring Provider:  Wendling, Nicholas Rome;   #1. Recent gastroenteritis (interestingly had C. Difficile, EPEC and crypto-treated with vancomycin/azithromycin October 2022, repeat stool study was positive for C. difficile toxin, treated with second course of vancomycin)  #2. Underlying IBS-alt diarrhea/constipation/abdo pain. Nl TSH.   #2. H/O colon Ca 2005 s/p sigmoid resection. Last colon: neg 06/2018.  Mildly elevated CEA 3.7  #3. RUQ/epi pain/ pain. Neg EGD prev, s/p lap chole in past  Plan: -Meals 4-6/day. -bentyl 20mg  po QID 1/2hr before meals and bedtime #120 -Ensure 1/day -EGD/colon with MiraLAX prep -Hold off on PPIs at this time -D/W pt and pts mom   I discussed EGD/Colonoscopy- the indications, risks, alternatives and potential complications including, but not limited to, bleeding, infection, reaction to medication, damage to internal organs, cardiac and/or pulmonary problems, and perforation requiring surgery. The possibility that significant findings could be missed was explained. All ? were answered. The patient gives consent to proceed.   INS Did not improve for EGD.  only approved for colonoscopy.  hence, would just proceed with colonoscopy  HPI:    Michelle Potts is a 51 y.o. female  With H/O anxiety, under considerable stress-husband has been diagnosed as having stage IV non-small cell lung cancer (undergoing immunotherapy), personal history of colon cancer s/p sigmoid resection.  Last colonoscopy at [facility] 06/2018: neg with negative random colonic biopsies.  Anastomosis at 30 cm-patent.   Took antibiotics for sinus infection in October 2022.  She also has been eating a lot of vegetables.  Had severe diarrhea.  GI path was positive for C. difficile, EPEC and Cryptosporidium.  She was treated with vancomycin and short course of azithromycin.  She again had diarrhea.  Stool  studies were positive for C. difficile toxin prompting second course of vancomycin.  Her diarrhea has resolved.  She brings in pictures which are scanned in which showed solid stool.  She has been eating better.  Most recent stool studies including stool for calprotectin was normal.  Her most recent CMP, CRP was normal.  CBC is at baseline.  Her white cell count is 3.2 which is at baseline.  She does feel better from GI standpoint.  Back to baseline with alternating diarrhea and constipation.  No nocturnal symptoms.  Continues to have postprandial epigastric discomfort, chronic right upper quadrant abdominal pain and L mid quadrant abdominal pain.  Denies having any significant upper GI symptoms including nausea, vomiting, heartburn, regurgitation, odynophagia or dysphagia.  Wt Readings from Last 3 Encounters:  02/18/21 137 lb (62.1 kg)  01/15/21 137 lb 8 oz (62.4 kg)  12/22/20 142 lb 4 oz (64.5 kg)      Past GI procedures (at [facility]) Colonoscopy 06/2018: neg, neg random colonic biopsies.  Anastomosis at 30 cm.  Colonoscopy 06/2014: neg EGD 04/2013: Normal.  Negative duodenal biopsies for celiac.  CT AP 03/2020 with contrast 1. No specific abnormality to explain the patient's symptoms. 2.  Aortic Atherosclerosis (ICD10-I70.0). 3. Mild bilateral foraminal impingement at L5-S1.  Past Medical History:  Diagnosis Date   Allergy    Anemia    Anxiety    Arthritis    Colorectal cancer ([place]) 2005   Depression    IBS (irritable bowel syndrome)    Osteoporosis    Thyroid disease     Past Surgical History:  Procedure Laterality Date   ABDOMINAL HYSTERECTOMY     APPENDECTOMY  BOWEL RESECTION     CHOLECYSTECTOMY     COLON SURGERY     COLONOSCOPY  06/24/2014   [facility] Everywhere   ESOPHAGOGASTRODUODENOSCOPY  05/17/2013   [facility] Everywhere   UPPER GASTROINTESTINAL ENDOSCOPY      Family History  Problem Relation Age of Onset    Stroke Father    Colon cancer Neg Hx    Esophageal cancer Neg Hx    Rectal cancer Neg Hx    Stomach cancer Neg Hx     Social History   Tobacco Use   Smoking status: Former    Types: Cigarettes   Smokeless tobacco: Never  Vaping Use   Vaping Use: Former  Substance Use Topics   Alcohol use: Yes    Comment: socially - rarely   Drug use: No    Current Outpatient Medications  Medication Sig Dispense Refill   clonazePAM (KLONOPIN) 1 MG tablet Take 0.5-2 tablets (0.5-2 mg total) by mouth 2 (two) times daily as needed for anxiety. 60 tablet 1   SYNTHROID 100 MCG tablet Take 1 tablet (100 mcg total) by mouth daily before breakfast. 30 tablet 2   venlafaxine XR (EFFEXOR XR) 75 MG 24 hr capsule Take 1 capsule (75 mg total) by mouth daily with breakfast. 30 capsule 2   dicyclomine (BENTYL) 20 MG tablet Take 1 tablet (20 mg total) by mouth 2 (two) times daily. Take 1 tablet for 3 days then increase to 2 times a day 60 tablet 4   nortriptyline (PAMELOR) 25 MG capsule Take 1 capsule (25 mg total) by mouth at bedtime. (Patient not taking: Reported on 02/18/2021) 30 capsule 0   Vitamin D, Ergocalciferol, (DRISDOL) 1.25 MG (50000 UNIT) CAPS capsule Take 1 capsule (50,000 Units total) by mouth every 7 (seven) days. 12 capsule 0   Current Facility-Administered Medications  Medication Dose Route Frequency Provider Last Rate Last Admin   0.9 %  sodium chloride infusion  500 mL Intravenous Continuous Jackquline Denmark, MD        Allergies  Allergen Reactions   Gadobenate Nausea And Vomiting     MRI contrast media, Multihance induced nausea and vomiting     Codeine Nausea And Vomiting   Iodinated Contrast Media Itching    Itching immediately after contrast injection. Treated with Benadryl onsite. Must be pre-medicated prior to receiving any Iodinated contrast media in the future.    Review of Systems:  Psychiatric/Behavioral: Has anxiety or depression     Physical Exam:    BP 123/78    Pulse 73     Temp (!) 96.4 F (35.8 C)    Ht 5\' 8"  (1.727 m)    Wt 137 lb (62.1 kg)    SpO2 99%    BMI 20.83 kg/m  Wt Readings from Last 3 Encounters:  02/18/21 137 lb (62.1 kg)  01/15/21 137 lb 8 oz (62.4 kg)  12/22/20 142 lb 4 oz (64.5 kg)   Constitutional:  Well-developed, in no acute distress. Psychiatric: Normal mood and affect. Behavior is normal. HEENT: Pupils normal.  Conjunctivae are normal. No scleral icterus.  Cardiovascular: Normal rate, regular rhythm. No edema Pulmonary/chest: Effort normal and breath sounds normal. No wheezing, rales or rhonchi. Abdominal: Soft, nondistended. Nontender.  Some muscle wall tenderness.  Bowel sounds active throughout. There are no masses palpable. No hepatomegaly. Rectal: Deferred Neurological: Alert and oriented to person place and time. Skin: Skin is warm and dry. No rashes noted.    Carmell Austria, MD 02/18/2021,  10:34 AM  Cc: Shelda Pal*

## 2021-02-18 NOTE — Progress Notes (Signed)
PT taken to PACU. Monitors in place. VSS. Report given to RN. 

## 2021-02-18 NOTE — Progress Notes (Signed)
DT - VS  Pt is Hard of Hearing - No hearing aids.  Speak loudly to pt.

## 2021-02-18 NOTE — Op Note (Signed)
[name] Patient Name: Michelle Potts Procedure Date: 02/18/2021 10:34 AM MRN: [id number] Endoscopist: Jackquline Denmark , MD Age: 51 Referring MD:  Date of Birth: 05/26/1970 Gender: Female Account #: [id number] Procedure:                Colonoscopy Indications:              #1. Recent gastroenteritis.                           #2. Underlying IBS-alt diarrhea/constipation/abdo                            pain. Nl TSH.                           #3. H/O colon Ca 2005 s/p sigmoid resection. Mildly                            elevated CEA 3.7 Medicines:                Monitored Anesthesia Care Procedure:                Pre-Anesthesia Assessment:                           - Prior to the procedure, a History and Physical                            was performed, and patient medications and                            allergies were reviewed. The patient's tolerance of                            previous anesthesia was also reviewed. The risks                            and benefits of the procedure and the sedation                            options and risks were discussed with the patient.                            All questions were answered, and informed consent                            was obtained. Prior Anticoagulants: The patient has                            taken no previous anticoagulant or antiplatelet                            agents. ASA Grade Assessment: II - A patient with                            mild  systemic disease. After reviewing the risks                            and benefits, the patient was deemed in                            satisfactory condition to undergo the procedure.                           After obtaining informed consent, the colonoscope                            was passed under direct vision. Throughout the                            procedure, the patient's blood pressure, pulse, and                            oxygen saturations were  monitored continuously. The                            Olympus [id number]-[id number] (#[id number]) Colonoscope was                            introduced through the anus and advanced to the 2                            cm into the ileum. The colonoscopy was performed                            without difficulty. The patient tolerated the                            procedure well. The quality of the bowel                            preparation was good. The terminal ileum, ileocecal                            valve, appendiceal orifice, and rectum were                            photographed. Scope In: 10:43:47 AM Scope Out: 11:00:13 AM Scope Withdrawal Time: 0 hours 12 minutes 37 seconds  Total Procedure Duration: 0 hours 16 minutes 26 seconds  Findings:                 Two sessile polyps were found in the mid descending                            colon and proximal ascending colon. The polyps were                            4 to 8 mm in size. These polyps were removed with a  cold snare. Resection and retrieval were complete.                           The colon (entire examined portion) appeared                            normal. Biopsies for histology were taken with a                            cold forceps from the entire colon for evaluation                            of microscopic colitis.                           There was evidence of a prior end-to-end                            colo-colonic anastomosis in the sigmoid colon, 15                            cm from the anal verge. This was patent and was                            characterized by healthy appearing mucosa.                           The terminal ileum appeared normal.                           Non-bleeding internal hemorrhoids were found during                            retroflexion. The hemorrhoids were small and Grade                            I (internal hemorrhoids that do not prolapse).                            The exam was otherwise without abnormality on                            direct and retroflexion views. Complications:            No immediate complications. Estimated Blood Loss:     Estimated blood loss: none. Impression:               - Two 4 to 8 mm polyps in the mid descending colon                            and in the proximal ascending colon, removed with a                            cold snare. Resected and retrieved.                           -  The entire examined colon is normal. Biopsied.                           - Patent end-to-end colo-colonic anastomosis,                            characterized by healthy appearing mucosa.                           - The examined portion of the ileum was normal.                           - Non-bleeding internal hemorrhoids.                           - The examination was otherwise normal on direct                            and retroflexion views. Recommendation:           - Patient has a contact number available for                            emergencies. The signs and symptoms of potential                            delayed complications were discussed with the                            patient. Return to normal activities tomorrow.                            Written discharge instructions were provided to the                            patient.                           - Resume previous diet.                           - Continue present medications.                           - Await pathology results.                           - Repeat colonoscopy for surveillance based on                            pathology results.                           - The findings and recommendations were discussed                            with the patient's family.                           -  FU in GI clinic in 12 weeks. Jackquline Denmark, MD 02/18/2021 11:11:41 AM This report has been signed electronically.

## 2021-02-20 ENCOUNTER — Telehealth: Payer: Self-pay

## 2021-02-20 NOTE — Telephone Encounter (Signed)
°  Follow up Call-  Call back number 02/18/2021  Post procedure Call Back phone  # [phone] cell  Permission to leave phone message Yes  Some recent data might be hidden     1st follow up call made.  NALM

## 2021-02-28 ENCOUNTER — Encounter: Payer: Self-pay | Admitting: Gastroenterology

## 2021-03-17 ENCOUNTER — Encounter: Payer: Self-pay | Admitting: Family Medicine

## 2021-03-18 ENCOUNTER — Other Ambulatory Visit: Payer: Managed Care, Other (non HMO)

## 2021-04-09 ENCOUNTER — Telehealth: Payer: Self-pay

## 2021-04-09 NOTE — Telephone Encounter (Signed)
Patient returned call, I advised patient of her appointment.  ?

## 2021-04-09 NOTE — Telephone Encounter (Signed)
-----   Message from Gillermina Hu, RN sent at 02/19/2021  4:10 PM EST ----- ?Regarding: FU [facility] Safe 12 weeks ?Follow up 12 weeks: ?Per recent colonoscopy ? ?

## 2021-04-09 NOTE — Telephone Encounter (Signed)
Pt scheduled for 12 week follow up from Colonoscopy Recommendation: Pt scheduled for 05/27/2021 at 10:40 in [facility]:  ?Left message for pt to call back  ? ?

## 2021-05-10 ENCOUNTER — Encounter: Payer: Self-pay | Admitting: Family Medicine

## 2021-05-11 ENCOUNTER — Encounter: Payer: Self-pay | Admitting: Family Medicine

## 2021-05-11 ENCOUNTER — Telehealth (INDEPENDENT_AMBULATORY_CARE_PROVIDER_SITE_OTHER): Payer: Self-pay | Admitting: Family Medicine

## 2021-05-11 DIAGNOSIS — Z634 Disappearance and death of family member: Secondary | ICD-10-CM

## 2021-05-11 MED ORDER — DULOXETINE HCL 30 MG PO CPEP: 30.0000 mg | ORAL_CAPSULE | Freq: Every day | ORAL | 3 refills | Status: DC

## 2021-05-11 MED ORDER — DIAZEPAM 10 MG PO TABS
5.0000 mg | ORAL_TABLET | Freq: Three times a day (TID) | ORAL | 1 refills | Status: DC | PRN
Start: 2021-05-11 — End: 2021-08-17

## 2021-05-11 NOTE — Progress Notes (Addendum)
Chief Complaint  ?Patient presents with  ? grief  ? ? ?Subjective: ?Patient is a 51 y.o. female here for bereavement. Due to COVID-19 pandemic, we are interacting via telephone. I verified patient's ID using 2 identifiers. Patient agreed to proceed with visit via this method. Patient is at home, I am at office. Patient and I are present for visit.  ? ?Pt's spouse passed away last week. She is very tearful, anxious, having poor sleep, poor concentration. No SI or HI. No self medication. She is following with a Social worker. She was able to be with him during his last moments which provided great closure to her. He had Stage IV lung cancer and had fallen with extremely poor prognosis.  ? ?Past Medical History:  ?Diagnosis Date  ? Allergy   ? Anemia   ? Anxiety   ? Arthritis   ? Colorectal cancer ([place]) 2005  ? Depression   ? IBS (irritable bowel syndrome)   ? Osteoporosis   ? Thyroid disease   ? ? ?Objective: ?No conversational dyspnea ?Age appropriate judgment and insight ?Nml affect and mood ? ?Assessment and Plan: ?Bereavement - Plan: DULoxetine (CYMBALTA) 30 MG capsule, diazepam (VALIUM) 10 MG tablet ? ?Change Effexor to Cymbalta 30 mg/d. Failed several SSRIs in past. Would consider Seroquel or Abilify if no better. Change Klonopin to Valium prn. Try to get outside and walk. Cont w counseling. F/u in 3-4 weeks.  ?Total time: 15 min ?The patient voiced understanding and agreement to the plan. ? ?Shelda Pal, DO ?05/11/21  ?3:50 PM ? ? ? ? ?

## 2021-05-27 ENCOUNTER — Encounter: Payer: Self-pay | Admitting: Gastroenterology

## 2021-05-27 ENCOUNTER — Ambulatory Visit (INDEPENDENT_AMBULATORY_CARE_PROVIDER_SITE_OTHER): Payer: Managed Care, Other (non HMO) | Admitting: Gastroenterology

## 2021-05-27 VITALS — BP 108/70 | HR 76 | Ht 68.5 in | Wt 129.4 lb

## 2021-05-27 DIAGNOSIS — K582 Mixed irritable bowel syndrome: Secondary | ICD-10-CM

## 2021-05-27 MED ORDER — DICYCLOMINE HCL 10 MG PO CAPS: 10.0000 mg | ORAL_CAPSULE | Freq: Two times a day (BID) | ORAL | 4 refills | Status: DC

## 2021-05-27 MED ORDER — DICYCLOMINE HCL 10 MG PO CAPS: 10.0000 mg | ORAL_CAPSULE | Freq: Two times a day (BID) | ORAL | 6 refills | Status: DC

## 2021-05-27 NOTE — Progress Notes (Signed)
? ? ?Chief Complaint: FU ? ?Referring Provider:  Shelda Pal*    ? ? ?ASSESSMENT AND PLAN;  ? ?#1. IBS-D ? ?#2. H/O gastroenteritis- Resolved.  Interestingly had C. Difficile, EPEC and crypto-treated with vancomycin/azithromycin October 2022, repeat stool study was positive for C. difficile toxin, treated with second course of vancomycin.  ? ?#3. H/O colon Ca 2005 s/p sigmoid resection. Neg colon 02/2021 except for small tubular adenomas. Rpt colon 02/2028.  Earlier, if with any new problems. ? ?Plan: ?-Trial of bentyl '10mg'$  po BID 1/2hr before meals and bedtime #60, 2RF ?-Reassured pt ?-D/W pt and pts mom ?-Follow-up as needed ? ? ? ? ?HPI:   ? ?Michelle Potts is a 51 y.o. female  ?With H/O anxiety, under considerable stress-lost husband recently d/t stage IV non-small cell lung cancer, personal history of colon cancer s/p sigmoid resection. ? ?S/P colon 02/2021 without recurrence.  2 small colon polyps s/p polypectomy (tubular adenomas), neg random colon biopsies for microscopic colitis. Nl TI. ? ?Overall she does feel better. Bms 3-4/day, softer, without nocturnal symptoms, has not tried Bentyl ? ?Has gained wt 124 to 129lb ? ?Overall pleased with the progress. ? ?Not having any upper GI symptoms.  Of note that her insurance denied EGD ? ?Has stopped taking Synthroid on her own.  She will get in touch with Dr. Nani Ravens regarding TSH ? ? ? ? ?From previous notes: ?had severe diarrhea 10/2020  GI path was positive for C. difficile, EPEC and Cryptosporidium.  She was treated with vancomycin and short course of azithromycin.  She again had diarrhea.  Stool studies were positive for C. difficile toxin prompting second course of vancomycin. ? ? ? ?Past GI procedures (at [facility]) ?Colonoscopy 06/2018: neg, neg random colonic biopsies.  Anastomosis at 30 cm.  ?Colonoscopy 06/2014: neg ?EGD 04/2013: Normal.  Negative duodenal biopsies for celiac. ? ?CT AP 03/2020 with contrast ?1. No specific abnormality  to explain the patient's symptoms. ?2.  Aortic Atherosclerosis (ICD10-I70.0). ?3. Mild bilateral foraminal impingement at L5-S1. ? ?Colon 02/2021 ?- Two 4 to 8 mm polyps in the mid descending colon and in the proximal ascending colon, ?removed with a cold snare. Resected and retrieved. Rpt colon 7 yrs. ?- The entire examined colon is normal. Bx- neg for microscopic coloitis ?- Patent end-to-end colo-colonic anastomosis, characterized by healthy appearing mucosa. ?- The examined portion of the ileum was normal. ?- Non-bleeding internal hemorrhoids. ?- The examination was otherwise normal on direct and retroflexion views. ? ?Past Medical History:  ?Diagnosis Date  ? Allergy   ? Anemia   ? Anxiety   ? Arthritis   ? Colorectal cancer ([place]) 2005  ? Depression   ? IBS (irritable bowel syndrome)   ? Osteoporosis   ? Thyroid disease   ? ? ?Past Surgical History:  ?Procedure Laterality Date  ? ABDOMINAL HYSTERECTOMY    ? APPENDECTOMY    ? BOWEL RESECTION    ? CHOLECYSTECTOMY    ? COLON SURGERY    ? COLONOSCOPY  06/24/2014  ? [facility] Everywhere  ? ESOPHAGOGASTRODUODENOSCOPY  05/17/2013  ? [facility]where  ? UPPER GASTROINTESTINAL ENDOSCOPY    ? ? ?Family History  ?Problem Relation Age of Onset  ? Stroke Father   ? Colon cancer Neg Hx   ? Esophageal cancer Neg Hx   ? Rectal cancer Neg Hx   ? Stomach cancer Neg Hx   ? ? ?Social History  ? ?Tobacco Use  ?  Smoking status: Former  ?  Types: Cigarettes  ? Smokeless tobacco: Never  ?Vaping Use  ? Vaping Use: Former  ?Substance Use Topics  ? Alcohol use: Yes  ?  Comment: socially - rarely  ? Drug use: No  ? ? ?Current Outpatient Medications  ?Medication Sig Dispense Refill  ? diazepam (VALIUM) 10 MG tablet Take 0.5-1 tablets (5-10 mg total) by mouth every 8 (eight) hours as needed for anxiety. 30 tablet 1  ? DULoxetine (CYMBALTA) 30 MG capsule Take 1 capsule (30 mg total) by mouth daily. 30 capsule 3  ? ?No current facility-administered medications for this  visit.  ? ? ?Allergies  ?Allergen Reactions  ? Gadobenate Nausea And Vomiting  ?   MRI contrast media, Multihance induced nausea and vomiting    ? Codeine Nausea And Vomiting  ? Iodinated Contrast Media Itching  ?  Itching immediately after contrast injection. Treated with Benadryl onsite. Must be pre-medicated prior to receiving any Iodinated contrast media in the future.  ? ? ?Review of Systems:  ?Psychiatric/Behavioral: Has anxiety or depression ? ?  ? ?Physical Exam:   ? ?BP 108/70   Pulse 76   Ht 5' 8.5" (1.74 m)   Wt 129 lb 6 oz (58.7 kg)   SpO2 98%   BMI 19.39 kg/m?  ?Wt Readings from Last 3 Encounters:  ?05/27/21 129 lb 6 oz (58.7 kg)  ?02/18/21 137 lb (62.1 kg)  ?01/15/21 137 lb 8 oz (62.4 kg)  ? ?Constitutional:  Well-developed, in no acute distress. ?Psychiatric: Normal mood and affect. Behavior is normal. ?HEENT: Pupils normal.  Conjunctivae are normal. No scleral icterus.  ?Cardiovascular: Normal rate, regular rhythm. No edema ?Pulmonary/chest: Effort normal and breath sounds normal. No wheezing, rales or rhonchi. ?Abdominal: Soft, nondistended. Nontender.  Some muscle wall tenderness.  Bowel sounds active throughout. There are no masses palpable. No hepatomegaly. ?Rectal: Deferred ?Neurological: Alert and oriented to person place and time. ?Skin: Skin is warm and dry. No rashes noted. ? ? ? ?Carmell Austria, MD 05/27/2021, 10:45 AM ? ?Cc: Shelda Pal* ? ? ?

## 2021-05-27 NOTE — Addendum Note (Signed)
Addended by: Curlene Labrum E on: 05/27/2021 12:18 PM ? ? Modules accepted: Orders ? ?

## 2021-05-27 NOTE — Patient Instructions (Addendum)
If you are age 52 or older, your body mass index should be between 23-30. Your Body mass index is 19.39 kg/m?Marland Kitchen If this is out of the aforementioned range listed, please consider follow up with your Primary Care Provider. ? ?If you are age 56 or younger, your body mass index should be between 19-25. Your Body mass index is 19.39 kg/m?Marland Kitchen If this is out of the aformentioned range listed, please consider follow up with your Primary Care Provider.  ? ?________________________________________________________ ? ?The [facility] providers would like to encourage you to use [facility] to communicate with providers for non-urgent requests or questions.  Due to long hold times on the telephone, sending your provider a message by [facility] may be a faster and more efficient way to get a response.  Please allow 48 business hours for a response.  Please remember that this is for non-urgent requests.  ?_______________________________________________________ ? ?Continue current medications ? ?We have sent the following medications to your pharmacy for you to pick up at your convenience: ?Bentyl take 30 minutes before meal and bedtime ? ?Please call with any questions or concerns. ? ?Thank you, ? ?Dr. Jackquline Denmark ? ? ? ? ? ?We want to thank you for trusting [facility] with your care. All of our staff and providers value the relationships we have built with our patients, and it is an honor to care for you.  ? ?We are writing to let you know that [facility] will close on Jun 01, 2021, and we invite you to continue to see Dr. Carmell Austria and Gerrit Heck at the [facility] [place] office location. We are consolidating our serices at these [facility] practices to better provide care. Our office staff will work with you to ensure a seamless transition.  ? ?Gerrit Heck, DO -Dr. Bryan Lemma will be movig to [facility] at [address], [place], [place] [address],  effective Jun 01, 2021.  Contact [phone] to schedule an appointment with him.  ? ?Carmell Austria, MD- Dr. Lyndel Safe will be movig to [facility] at [address], [place], [place] [address], effective Jun 01, 2021.  Contact [phone] to schedule an appointment with him.  ? ?Requesting Medical Records ?If you need to request your medical records, please follow the instructions below. Your medical records are confidential, and a copy can be transferred to another provider or released to you or another person you designate only with your permission. ? ?There are several ways to request your medical records: ?Requests for medical records can be submitted through our practice.   ?You can also request your records electronically, in your MyChart account by selecting the ?Request Health Records? tab.  ?If you need additional information on how to request records, please go to [email or web address]/, choose Patient Information, then select Request Medical Records. ?To make an appointment or if you have any questions about your health care needs, please contact our office at [phone] and one of our staff members will be glad to assist you. ?[facility] is committed to providing exceptional care for you and our community. Thank you for allowing [place] to serve your health care needs. ?Sincerely, ? ?Windy Canny, Director [facility] ? also offers convenient virtual care options. Sore throat? Sinus problems? Cold or flu symptoms? Get care from the comfort of home with [facility] Video Visits and e-Visits. Learn more about the non-emergency conditions treated and start your virtual visit at [email or web address] ? ?

## 2021-08-13 ENCOUNTER — Encounter: Payer: Self-pay | Admitting: Gastroenterology

## 2021-08-17 ENCOUNTER — Other Ambulatory Visit: Payer: Self-pay | Admitting: Family Medicine

## 2021-08-17 DIAGNOSIS — R109 Unspecified abdominal pain: Secondary | ICD-10-CM

## 2021-08-17 DIAGNOSIS — Z634 Disappearance and death of family member: Secondary | ICD-10-CM

## 2021-08-17 DIAGNOSIS — R194 Change in bowel habit: Secondary | ICD-10-CM

## 2021-08-17 DIAGNOSIS — F411 Generalized anxiety disorder: Secondary | ICD-10-CM

## 2021-08-17 NOTE — Telephone Encounter (Signed)
Last RF--#30 with 1 refill on 05/11/21. Last OV---05/11/2021

## 2021-08-27 DIAGNOSIS — F1420 Cocaine dependence, uncomplicated: Secondary | ICD-10-CM | POA: Diagnosis not present

## 2021-08-27 DIAGNOSIS — F4323 Adjustment disorder with mixed anxiety and depressed mood: Secondary | ICD-10-CM | POA: Diagnosis not present

## 2021-09-10 DIAGNOSIS — F4323 Adjustment disorder with mixed anxiety and depressed mood: Secondary | ICD-10-CM | POA: Diagnosis not present

## 2021-09-10 DIAGNOSIS — F1420 Cocaine dependence, uncomplicated: Secondary | ICD-10-CM | POA: Diagnosis not present

## 2021-10-01 ENCOUNTER — Encounter: Payer: Self-pay | Admitting: Family Medicine

## 2021-10-01 ENCOUNTER — Ambulatory Visit (INDEPENDENT_AMBULATORY_CARE_PROVIDER_SITE_OTHER): Payer: BC Managed Care – PPO | Admitting: Family Medicine

## 2021-10-01 VITALS — BP 112/81 | HR 89 | Temp 99.0°F | Resp 16 | Wt 155.0 lb

## 2021-10-01 DIAGNOSIS — L300 Nummular dermatitis: Secondary | ICD-10-CM

## 2021-10-01 DIAGNOSIS — F411 Generalized anxiety disorder: Secondary | ICD-10-CM

## 2021-10-01 MED ORDER — TRIAMCINOLONE ACETONIDE 0.1 % EX CREA: 1.0000 | TOPICAL_CREAM | Freq: Two times a day (BID) | CUTANEOUS | 0 refills | Status: DC

## 2021-10-01 MED ORDER — DULOXETINE HCL 60 MG PO CPEP: 60.0000 mg | ORAL_CAPSULE | Freq: Every day | ORAL | 2 refills | Status: DC

## 2021-10-01 MED ORDER — LORAZEPAM 1 MG PO TABS: 0.5000 mg | ORAL_TABLET | Freq: Three times a day (TID) | ORAL | 2 refills | Status: DC

## 2021-10-01 MED ORDER — TRAZODONE HCL 50 MG PO TABS: 50.0000 mg | ORAL_TABLET | Freq: Every evening | ORAL | 2 refills | Status: DC | PRN

## 2021-10-01 NOTE — Patient Instructions (Addendum)
Try not to scratch as this can make things worse. Avoid scented products while dealing with this. You may resume when the itchiness resolves. Cold/cool compresses can help.   Aim to do some physical exertion for 150 minutes per week. This is typically divided into 5 days per week, 30 minutes per day. The activity should be enough to get your heart rate up. Anything is better than nothing if you have time constraints.  I love that you are following with a therapist, please continue with that.  Let us know if you need anything.

## 2021-10-01 NOTE — Progress Notes (Signed)
Chief Complaint  Patient presents with   Rash    Complains of skin rash on left arm near elbow. First noticed about 3 weeks ago   Insomnia    Having trouble falling and staying sleep    Michelle Potts is a 51 y.o. female here for a skin complaint.  She is here with her mother.  Duration: 3 weeks Location: L elbow Pruritic? Yes Painful? No Drainage? No New soaps/lotions/topicals/detergents? No Sick contacts? No Other associated symptoms: slight swelling; no fevers Therapies tried thus far: OTC steroid cream  Her husband died around 5 months ago.  She is having difficulty sleeping.  Historically she has done very well with combination of Ativan and trazodone.  She is requesting refills of this.  She is following with both her bereavement counselor and a personal therapist.  This is helpful.  She is currently compliant with Cymbalta 30 mg daily.  She is interested in increasing this dosage that she is still having anxiety and depressive type symptoms.  No homicidal or suicidal ideation.  No self-medication.  Past Medical History:  Diagnosis Date   Allergy    Anemia    Anxiety    Arthritis    Colorectal cancer ([place]) 2005   Depression    IBS (irritable bowel syndrome)    Osteoporosis    Thyroid disease     BP 112/81 (BP Location: Left Arm, Patient Position: Sitting, Cuff Size: Small)   Pulse 89   Temp 99 F (37.2 C) (Oral)   Resp 16   Wt 155 lb (70.3 kg)   SpO2 100%   BMI 23.22 kg/m  Gen: awake, alert, appearing stated age Lungs: No accessory muscle use Skin: There are 3 circular patches that are salmon-colored over the proximal dorsum of the left forearm and proximal distal left upper arm.  They measure approximately 2 cm in diameter.  Minimal scaling noted.  No drainage, erythema, TTP, fluctuance, excoriation Psych: Age appropriate judgment and insight  Nummular eczema - Plan: triamcinolone cream (KENALOG) 0.1 %  GAD (generalized anxiety disorder) - Plan: LORazepam  (ATIVAN) 1 MG tablet, DULoxetine (CYMBALTA) 60 MG capsule, traZODone (DESYREL) 50 MG tablet  Avoid scented products, try to avoid scratching, triamcinolone twice daily for 7 to 10 days.  Would consider biopsy if no improvement. Chronic, uncontrolled.  Increase duloxetine from 30 mg daily to 60 mg daily.  We will add back trazodone 50 g nightly as needed in addition to Ativan 0.5-1 mg 3 times daily as needed.  Continue with the counseling team.  Counseled on exercise.  I would like her to follow-up in 1 month for a physical in addition to recheck this. The patient and her mother voiced understanding and agreement to the plan.  [place], DO 10/01/21 4:00 PM

## 2021-10-15 DIAGNOSIS — F4310 Post-traumatic stress disorder, unspecified: Secondary | ICD-10-CM | POA: Diagnosis not present

## 2021-10-15 DIAGNOSIS — F4323 Adjustment disorder with mixed anxiety and depressed mood: Secondary | ICD-10-CM | POA: Diagnosis not present

## 2021-10-22 DIAGNOSIS — F4323 Adjustment disorder with mixed anxiety and depressed mood: Secondary | ICD-10-CM | POA: Diagnosis not present

## 2021-10-22 DIAGNOSIS — F1420 Cocaine dependence, uncomplicated: Secondary | ICD-10-CM | POA: Diagnosis not present

## 2021-10-29 DIAGNOSIS — F1420 Cocaine dependence, uncomplicated: Secondary | ICD-10-CM | POA: Diagnosis not present

## 2021-10-29 DIAGNOSIS — F4323 Adjustment disorder with mixed anxiety and depressed mood: Secondary | ICD-10-CM | POA: Diagnosis not present

## 2021-11-05 DIAGNOSIS — F4323 Adjustment disorder with mixed anxiety and depressed mood: Secondary | ICD-10-CM | POA: Diagnosis not present

## 2021-11-05 DIAGNOSIS — F1420 Cocaine dependence, uncomplicated: Secondary | ICD-10-CM | POA: Diagnosis not present

## 2021-11-09 ENCOUNTER — Ambulatory Visit (INDEPENDENT_AMBULATORY_CARE_PROVIDER_SITE_OTHER): Payer: BC Managed Care – PPO | Admitting: Family Medicine

## 2021-11-09 ENCOUNTER — Other Ambulatory Visit: Payer: Self-pay | Admitting: Family Medicine

## 2021-11-09 ENCOUNTER — Encounter: Payer: Self-pay | Admitting: Family Medicine

## 2021-11-09 VITALS — BP 112/72 | HR 74 | Temp 98.1°F | Ht 68.0 in | Wt 147.0 lb

## 2021-11-09 DIAGNOSIS — F411 Generalized anxiety disorder: Secondary | ICD-10-CM | POA: Diagnosis not present

## 2021-11-09 DIAGNOSIS — S46812A Strain of other muscles, fascia and tendons at shoulder and upper arm level, left arm, initial encounter: Secondary | ICD-10-CM | POA: Diagnosis not present

## 2021-11-09 DIAGNOSIS — M7712 Lateral epicondylitis, left elbow: Secondary | ICD-10-CM

## 2021-11-09 DIAGNOSIS — E876 Hypokalemia: Secondary | ICD-10-CM

## 2021-11-09 DIAGNOSIS — Z23 Encounter for immunization: Secondary | ICD-10-CM | POA: Diagnosis not present

## 2021-11-09 DIAGNOSIS — R413 Other amnesia: Secondary | ICD-10-CM

## 2021-11-09 DIAGNOSIS — Z0000 Encounter for general adult medical examination without abnormal findings: Secondary | ICD-10-CM

## 2021-11-09 LAB — COMPREHENSIVE METABOLIC PANEL
ALT: 8 U/L (ref 0–35)
AST: 12 U/L (ref 0–37)
Albumin: 4.5 g/dL (ref 3.5–5.2)
Alkaline Phosphatase: 53 U/L (ref 39–117)
BUN: 13 mg/dL (ref 6–23)
CO2: 31 mEq/L (ref 19–32)
Calcium: 9.3 mg/dL (ref 8.4–10.5)
Chloride: 99 mEq/L (ref 96–112)
Creatinine, Ser: 1.05 mg/dL (ref 0.40–1.20)
GFR: 61.05 mL/min (ref >60.00)
Glucose, Bld: 94 mg/dL (ref 70–99)
Potassium: 3.4 mEq/L — ABNORMAL LOW (ref 3.5–5.1)
Sodium: 138 mEq/L (ref 135–145)
Total Bilirubin: 0.6 mg/dL (ref 0.2–1.2)
Total Protein: 7.5 g/dL (ref 6.0–8.3)

## 2021-11-09 LAB — CBC
HCT: 36.7 % (ref 36.0–46.0)
Hemoglobin: 12.2 g/dL (ref 12.0–15.0)
MCHC: 33.4 g/dL (ref 30.0–36.0)
MCV: 91.5 fl (ref 78.0–100.0)
Platelets: 166 10*3/uL (ref 150.0–400.0)
RBC: 4.01 Mil/uL (ref 3.87–5.11)
RDW: 12.9 % (ref 11.5–15.5)
WBC: 4.7 10*3/uL (ref 4.0–10.5)

## 2021-11-09 LAB — LIPID PANEL
Cholesterol: 235 mg/dL — ABNORMAL HIGH (ref 0–200)
HDL: 58.9 mg/dL (ref >39.00)
LDL Cholesterol: 158 mg/dL — ABNORMAL HIGH (ref 0–99)
NonHDL: 175.9
Total CHOL/HDL Ratio: 4
Triglycerides: 92 mg/dL (ref 0.0–149.0)
VLDL: 18.4 mg/dL (ref 0.0–40.0)

## 2021-11-09 LAB — VITAMIN B12: Vitamin B-12: 236 pg/mL (ref 211–911)

## 2021-11-09 LAB — TSH: TSH: 5.56 u[IU]/mL — ABNORMAL HIGH (ref 0.35–5.50)

## 2021-11-09 MED ORDER — DULOXETINE HCL 60 MG PO CPEP: 60.0000 mg | ORAL_CAPSULE | Freq: Two times a day (BID) | ORAL | 3 refills | Status: DC

## 2021-11-09 NOTE — Progress Notes (Signed)
Chief Complaint  Patient presents with   Annual Exam     Well Woman Michelle Potts is here for a complete physical.   Her last physical was >1 year ago.  Current diet: in general, diet is fair. Current exercise: none. Weight is stable and she confirms fatigue out of ordinary. Seatbelt? Yes Advanced directive? No  Health Maintenance Mammogram- Yes Colon cancer screening-Yes Shingrix- No Tetanus- Yes Hep C screening- Yes HIV screening- Yes  Past Medical History:  Diagnosis Date   Allergy    Anemia    Anxiety    Arthritis    Colorectal cancer ([place]) 2005   Depression    IBS (irritable bowel syndrome)    Osteoporosis    Thyroid disease      Past Surgical History:  Procedure Laterality Date   ABDOMINAL HYSTERECTOMY     APPENDECTOMY     BOWEL RESECTION     CHOLECYSTECTOMY     COLON SURGERY     COLONOSCOPY  06/24/2014   [facility] Everywhere   ESOPHAGOGASTRODUODENOSCOPY  05/17/2013   [facility] Everywhere   UPPER GASTROINTESTINAL ENDOSCOPY      Medications  Current Outpatient Medications on File Prior to Visit  Medication Sig Dispense Refill   DULoxetine (CYMBALTA) 60 MG capsule Take 1 capsule (60 mg total) by mouth daily. 90 capsule 2   LORazepam (ATIVAN) 1 MG tablet Take 0.5-1 tablets (0.5-1 mg total) by mouth every 8 (eight) hours. 30 tablet 2   traZODone (DESYREL) 50 MG tablet Take 1 tablet (50 mg total) by mouth at bedtime as needed for sleep. 90 tablet 2   triamcinolone cream (KENALOG) 0.1 % Apply 1 Application topically 2 (two) times daily. 30 g 0    Allergies Allergies  Allergen Reactions   Gadobenate Nausea And Vomiting     MRI contrast media, Multihance induced nausea and vomiting     Codeine Nausea And Vomiting   Iodinated Contrast Media Itching    Itching immediately after contrast injection. Treated with Benadryl onsite. Must be pre-medicated prior to receiving any Iodinated contrast media in the future.    Review of  Systems: Constitutional:  no unexpected weight changes Eye:  no recent significant change in vision Ear/Nose/Mouth/Throat:  Ears:  no recent change in hearing Nose/Mouth/Throat:  no complaints of nasal congestion, no sore throat Cardiovascular: no chest pain Respiratory:  no shortness of breath Gastrointestinal:  no abdominal pain, no change in bowel habits GU:  Female: negative for dysuria or pelvic pain Musculoskeletal/Extremities: + Left arm pain Integumentary (Skin/Breast):  no abnormal skin lesions reported Neurologic: + Memory lapses Endocrine:  denies fatigue  Exam BP 112/72 (BP Location: Left Arm, Patient Position: Sitting, Cuff Size: Normal)   Pulse 74   Temp 98.1 F (36.7 C) (Oral)   Ht '5\' 8"'$  (1.727 m)   Wt 147 lb (66.7 kg)   SpO2 98%   BMI 22.35 kg/m  General:  well developed, well nourished, in no apparent distress Skin:  no significant moles, warts, or growths Head:  no masses, lesions, or tenderness Eyes:  pupils equal and round, sclera anicteric without injection Ears: Hard of hearing; Canals without lesions, TMs shiny without retraction, no obvious effusion, no erythema Nose:  nares patent, mucosa normal, and no drainage  Throat/Pharynx:  lips and gingiva without lesion; tongue and uvula midline; non-inflamed pharynx; no exudates or postnasal drainage Neck: neck supple without adenopathy, thyromegaly, or masses Lungs:  clear to auscultation, breath sounds equal bilaterally, no respiratory distress  Cardio:  regular rate and rhythm, no LE edema Abdomen:  abdomen soft, nontender; bowel sounds normal; no masses or organomegaly Genital: Defer to GYN Musculoskeletal: TTP over the left lateral upper condyle and left trapezius, otherwise symmetrical muscle groups noted without atrophy or deformity Extremities:  no clubbing, cyanosis, or edema, no deformities, no skin discoloration Neuro:  gait normal; deep tendon reflexes normal and symmetric Psych: well oriented with  normal range of affect and appropriate judgment/insight  Assessment and Plan  Well adult exam - Plan: Lipid panel, CBC, Comprehensive metabolic panel  GAD (generalized anxiety disorder)  Memory deficit - Plan: TSH, B12  Strain of left trapezius muscle, initial encounter  Lateral epicondylitis of left elbow  Need for influenza vaccination - Plan: Flu Vaccine QUAD 6+ mos PF IM (Fluarix Quad PF)   Well 51 y.o. female. Counseled on diet and exercise. Advanced directive form provided today.  Shingrix rec'd.  Flu shot today.  GAD: cont w counseling. Increase dosage of Cymbalta from 60 mg/d to 60 mg bid.  Stretches and exercises for the trapezius and tennis elbow.  Forearm strap recommended as well. Other orders as above. Follow up in 1 mo. The patient voiced understanding and agreement to the plan.  [place], DO 11/09/21 11:53 AM

## 2021-11-09 NOTE — Patient Instructions (Addendum)
Give [place] 2-3 business days to get the results of your labs back.   Keep the diet clean and stay active.  Aim to do some physical exertion for 150 minutes per week. This is typically divided into 5 days per week, 30 minutes per day. The activity should be enough to get your heart rate up. Anything is better than nothing if you have time constraints.  Please get me a copy of your advanced directive form at your convenience.  Consider a forearm strap (Band-IT) to help with your elbow. This can give your elbow a break and allow it to heal faster.  Try to drink 55-60 oz of water daily outside of exercise.  Let us know if you need anything.  Trapezius stretches/exercises Do exercises exactly as told by your health care provider and adjust them as directed. It is normal to feel mild stretching, pulling, tightness, or discomfort as you do these exercises, but you should stop right away if you feel sudden pain or your pain gets worse.   Stretching and range of motion exercises These exercises warm up your muscles and joints and improve the movement and flexibility of your shoulder. These exercises can also help to relieve pain, numbness, and tingling. If you are unable to do any of the following for any reason, do not further attempt to do it.   Exercise A: Flexion, standing     Stand and hold a broomstick, a cane, or a similar object. Place your hands a little more than shoulder-width apart on the object. Your left / right hand should be palm-up, and your other hand should be palm-down. Push the stick to raise your left / right arm out to your side and then over your head. Use your other hand to help move the stick. Stop when you feel a stretch in your shoulder, or when you reach the angle that is recommended by your health care provider. Avoid shrugging your shoulder while you raise your arm. Keep your shoulder blade tucked down toward your spine. Hold for 30 seconds. Slowly return to the starting  position. Repeat 2 times. Complete this exercise 3 times per week.  Exercise B: Abduction, supine     Lie on your back and hold a broomstick, a cane, or a similar object. Place your hands a little more than shoulder-width apart on the object. Your left / right hand should be palm-up, and your other hand should be palm-down. Push the stick to raise your left / right arm out to your side and then over your head. Use your other hand to help move the stick. Stop when you feel a stretch in your shoulder, or when you reach the angle that is recommended by your health care provider. Avoid shrugging your shoulder while you raise your arm. Keep your shoulder blade tucked down toward your spine. Hold for 30 seconds. Slowly return to the starting position. Repeat 2 times. Complete this exercise 3 times per week.  Exercise C: Flexion, active-assisted     Lie on your back. You may bend your knees for comfort. Hold a broomstick, a cane, or a similar object. Place your hands about shoulder-width apart on the object. Your palms should face toward your feet. Raise the stick and move your arms over your head and behind your head, toward the floor. Use your healthy arm to help your left / right arm move farther. Stop when you feel a gentle stretch in your shoulder, or when you reach the angle  where your health care provider tells you to stop. Hold for 30 seconds. Slowly return to the starting position. Repeat 2 times. Complete this exercise 3 times per week.  Exercise D: External rotation and abduction     Stand in a door frame with one of your feet slightly in front of the other. This is called a staggered stance. Choose one of the following positions as told by your health care provider: Place your hands and forearms on the door frame above your head. Place your hands and forearms on the door frame at the height of your head. Place your hands on the door frame at the height of your elbows. Slowly move  your weight onto your front foot until you feel a stretch across your chest and in the front of your shoulders. Keep your head and chest upright and keep your abdominal muscles tight. Hold for 30 seconds. To release the stretch, shift your weight to your back foot. Repeat 2 times. Complete this stretch 3 times per week.  Strengthening exercises These exercises build strength and endurance in your shoulder. Endurance is the ability to use your muscles for a long time, even after your muscles get tired. Exercise E: Scapular depression and adduction  Sit on a stable chair. Support your arms in front of you with pillows, armrests, or a tabletop. Keep your elbows in line with the sides of your body. Gently move your shoulder blades down toward your middle back. Relax the muscles on the tops of your shoulders and in the back of your neck. Hold for 3 seconds. Slowly release the tension and relax your muscles completely before doing this exercise again. Repeat for a total of 10 repetitions. After you have practiced this exercise, try doing the exercise without the arm support. Then, try the exercise while standing instead of sitting. Repeat 2 times. Complete this exercise 3 times per week.  Exercise F: Shoulder abduction, isometric     Stand or sit about 4-6 inches (10-15 cm) from a wall with your left / right side facing the wall. Bend your left / right elbow and gently press your elbow against the wall. Increase the pressure slowly until you are pressing as hard as you can without shrugging your shoulder. Hold for 3 seconds. Slowly release the tension and relax your muscles completely. Repeat for a total of 10 repetitions. Repeat 2 times. Complete this exercise 3 times per week.  Exercise G: Shoulder flexion, isometric     Stand or sit about 4-6 inches (10-15 cm) away from a wall with your left / right side facing the wall. Keep your left / right elbow straight and gently press the top of your  fist against the wall. Increase the pressure slowly until you are pressing as hard as you can without shrugging your shoulder. Hold for 10-15 seconds. Slowly release the tension and relax your muscles completely. Repeat for a total of 10 repetitions. Repeat 2 times. Complete this exercise 3 times per week.  Exercise H: Internal rotation     Sit in a stable chair without armrests, or stand. Secure an exercise band at your left / right side, at elbow height. Place a soft object, such as a folded towel or a small pillow, under your left / right upper arm so your elbow is a few inches (about 8 cm) away from your side. Hold the end of the exercise band so the band stretches. Keeping your elbow pressed against the soft object under your  arm, move your forearm across your body toward your abdomen. Keep your body steady so the movement is only coming from your shoulder. Hold for 3 seconds. Slowly return to the starting position. Repeat for a total of 10 repetitions. Repeat 2 times. Complete this exercise 3 times per week.  Exercise I: External rotation     Sit in a stable chair without armrests, or stand. Secure an exercise band at your left / right side, at elbow height. Place a soft object, such as a folded towel or a small pillow, under your left / right upper arm so your elbow is a few inches (about 8 cm) away from your side. Hold the end of the exercise band so the band stretches. Keeping your elbow pressed against the soft object under your arm, move your forearm out, away from your abdomen. Keep your body steady so the movement is only coming from your shoulder. Hold for 3 seconds. Slowly return to the starting position. Repeat for a total of 10 repetitions. Repeat 2 times. Complete this exercise 3 times per week. Exercise J: Shoulder extension  Sit in a stable chair without armrests, or stand. Secure an exercise band to a stable object in front of you so the band is at shoulder  height. Hold one end of the exercise band in each hand. Your palms should face each other. Straighten your elbows and lift your hands up to shoulder height. Step back, away from the secured end of the exercise band, until the band stretches. Squeeze your shoulder blades together and pull your hands down to the sides of your thighs. Stop when your hands are straight down by your sides. Do not let your hands go behind your body. Hold for 3 seconds. Slowly return to the starting position. Repeat for a total of 10 repetitions. Repeat 2 times. Complete this exercise 3 times per week.  Exercise K: Shoulder extension, prone     Lie on your abdomen on a firm surface so your left / right arm hangs over the edge. Hold a 5 lb weight in your hand so your palm faces in toward your body. Your arm should be straight. Squeeze your shoulder blade down toward the middle of your back. Slowly raise your arm behind you, up to the height of the surface that you are lying on. Keep your arm straight. Hold for 3 seconds. Slowly return to the starting position and relax your muscles. Repeat for a total of 10 repetitions. Repeat 2 times. Complete this exercise 3 times per week.   Exercise L: Horizontal abduction, prone  Lie on your abdomen on a firm surface so your left / right arm hangs over the edge. Hold a 5 lb weight in your hand so your palm faces toward your feet. Your arm should be straight. Squeeze your shoulder blade down toward the middle of your back. Bend your elbow so your hand moves up, until your elbow is bent to an "L" shape (90 degrees). With your elbow bent, slowly move your forearm forward and up. Raise your hand up to the height of the surface that you are lying on. Your upper arm should not move, and your elbow should stay bent. At the top of the movement, your palm should face the floor. Hold for 3 seconds. Slowly return to the starting position and relax your muscles. Repeat for a total of 10  repetitions. Repeat 2 times. Complete this exercise 3 times per week.  Exercise M: Horizontal abduction, standing  Sit on a stable chair, or stand. Secure an exercise band to a stable object in front of you so the band is at shoulder height. Hold one end of the exercise band in each hand. Straighten your elbows and lift your hands straight in front of you, up to shoulder height. Your palms should face down, toward the floor. Step back, away from the secured end of the exercise band, until the band stretches. Move your arms out to your sides, and keep your arms straight. Hold for 3 seconds. Slowly return to the starting position. Repeat for a total of 10 repetitions. Repeat 2 times. Complete this exercise 3 times per week.  Exercise N: Scapular retraction and elevation  Sit on a stable chair, or stand. Secure an exercise band to a stable object in front of you so the band is at shoulder height. Hold one end of the exercise band in each hand. Your palms should face each other. Sit in a stable chair without armrests, or stand. Step back, away from the secured end of the exercise band, until the band stretches. Squeeze your shoulder blades together and lift your hands over your head. Keep your elbows straight. Hold for 3 seconds. Slowly return to the starting position. Repeat for a total of 10 repetitions. Repeat 2 times. Complete this exercise 3 times per week.  This information is not intended to replace advice given to you by your health care provider. Make sure you discuss any questions you have with your health care provider. Document Released: 01/04/2005 Document Revised: 09/11/2015 Document Reviewed: 11/21/2014 Elsevier Interactive Patient Education  2017 [place].  Elbow and Forearm Exercises It is normal to feel mild stretching, pulling, tightness, or discomfort as you do these exercises, but you should stop right away if you feel sudden pain or your pain gets worse.  RANGE  OF MOTION EXERCISES These exercises warm up your muscles and joints and improve the movement and flexibility of your injured elbow and forearm. These exercises also help to relieve pain, numbness, and tingling. These exercises are done using the muscles in your injured elbow and forearm. Exercise A: Elbow Flexion, Active Hold your left / right arm at your side, and bend your elbow as far as you can using your left / right arm muscles. Hold this position for 30 seconds. Slowly return to the starting position. Repeat 2 times. Complete this exercise 3 times per week. Exercise B: Elbow Extension, Active Hold your left / right arm at your side, and straighten your elbow as much as you can using your left / right arm muscles. Hold this position for 30 seconds. Slowly return to the starting position. Repeat 2 times. Complete this exercise 3 times per week. Exercise C: Forearm Rotation, Supination, Active Stand or sit with your elbows at your sides. Bend your left / right elbow to an "L" shape (90 degrees). Turn your palm upward until you feel a gentle stretch on the inside of your forearm. Hold this position for 30 seconds. Slowly release and return to the starting position. Repeat 2 times. Complete this exercise 3 times per week. Exercise D: Forearm Rotation, Pronation, Active Stand or sit with your elbows at your side. Bend your left / right elbow to an "L" shape (90 degrees). Turn your left / right palm downward until you feel a gentle stretch on the top of your forearm. Hold this position for 30 seconds. Slowly release and return to the starting position. Repeat 2 times. Complete this  exercise 3 times per week. STRETCHING EXERCISES These exercises warm up your muscles and joints and improve the movement and flexibility of your injured elbow and forearm. These exercises also help to relieve pain, numbness, and tingling. These exercises are done using your healthy elbow and forearm to help  stretch the muscles in your injured elbow and forearm. Exercise E: Elbow Flexion, Active-Assisted  Hold your left / right arm at your side, and bend your elbow as much as you can using your left / right arm muscles. Use your other hand to bend your left / right elbow farther. To do this, gently push up on your forearm until you feel a gentle stretch on the back of your elbow. Hold this position for 30 seconds. Slowly return to the starting position. Repeat 2 times. Complete this exercise 3 times per week. Exercise F: Elbow Extension, Active-Assisted  Hold your left / right arm at your side, and straighten your elbow as much as you can using your left / right arm muscles. Use your other hand to straighten the left / right elbow farther. To do this, gently push down on your forearm until you feel a gentle stretch on the inside of your elbow. Hold this position for 30 seconds. Slowly return to the starting position. Repeat 2 times. Complete this exercise 3 times per weeky. Exercise G: Forearm Rotation, Supination, Active-Assisted  Sit with your left / right elbow bent in an "L" shape (90 degrees) with your forearm resting on a table. Keeping your upper body and shoulder still, rotate your forearm so your left / right palm faces upward. Use your other hand to help rotate your forearm further until you feel a gentle to moderate stretch. Hold this position for 30 seconds. Slowly release the stretch and return to the starting position. Repeat 2 times. Complete this exercise 3 times per week. Exercise H: Forearm Rotation, Pronation, Active-Assisted  Sit with your left / right elbow bent in an "L" shape (90 degrees) with your forearm resting on a table. Keeping your upper body and shoulder still, rotate your forearm so your palm faces the tabletop. Use your other hand to help rotate your forearm further until you feel a gentle to moderate stretch. Hold this position for 30 seconds. Slowly release  the stretch and return to the starting position. Repeat 2 times. Complete this exercise 3 times per week. Exercise I: Elbow Flexion, Supine, Passive Lie on your back. Extend your left / right arm up in the air, bracing it with your other hand. Let your left / right your hand slowly lower toward your shoulder, while your elbow stays pointed toward the ceiling. You should feel a gentle stretch along the back of your upper arm and elbow. If instructed by your health care provider, you may increase the intensity of your stretch by adding a small wrist weight or hand weight. Hold this position for 3 seconds. Slowly return to the starting position. Repeat 2 times. Complete this exercise 3 times per week. Exercise J: Elbow Extension, Supine, Passive  Lie on your back. Make sure that you are in a comfortable position that lets you relax your arm muscles. Place a folded towel under your left / right upper arm so your elbow and shoulder are at the same height. Straighten your left / right arm so your elbow does not rest on the bed or towel. Let the weight of your hand stretch your elbow. Keep your arm and chest muscles relaxed. You should  feel a stretch on the inside of your elbow. If told by your health care provider, you may increase the intensity of your stretch by adding a small wrist weight or hand weight. Hold this position for 30 seconds. Slowly release the stretch. Repeat 2 times. Complete this exercise 3 times per week. STRENGTHENING EXERCISES These exercises build strength and endurance in your elbow and forearm. Endurance is the ability to use your muscles for a long time, even after they get tired. Exercise K: Elbow Flexion, Isometric  Stand or sit up straight. Bend your left / right elbow in an "L" shape (90 degrees) and turn your palm up so your forearm is at the height of your waist. Place your other hand on top of your forearm. Gently push down as your left / right arm resists. Push as  hard as you can with both arms without causing any pain or movement at your left / right elbow. Hold this position for 3 seconds. Slowly release the tension in both arms. Let your muscles relax completely before repeating. Repeat 2 times. Complete this exercise 3 times per week. Exercise L: Elbow Extensors, Isometric  Stand or sit up straight. Place your left / right arm so your palm faces your abdomen and it is at the height of your waist. Place your other hand on the underside of your forearm. Gently push up as your left / right arm resists. Push as hard as you can with both arms, without causing any pain or movement at your left / right elbow. Hold this position for 3 seconds. Slowly release the tension in both arms. Let your muscles relax completely before repeating. Repeat _______2___ times. Complete this exercise 3 times per week. Exercise M: Elbow Flexion With Forearm Palm Up  Sit upright on a firm chair without armrests, or stand. Place your left / right arm at your side with your palm facing forward. Holding a 5 lbweight or gripping a rubber exercise band or tubing, bend your elbow to bring your hand toward your shoulder. Hold this position for 3 seconds. Slowly return to the starting position. Repeat 2 times. Complete this exercise 3 times per week. Exercise N: Elbow Extension  Sit on a firm chair without armrests, or stand. Keeping your upper arms at your sides, bring both hands up toward your left / right shoulder while you grip a rubber exercise band or tubing. Your left / right hand should be just below the other hand. Straighten your left / right elbow. Hold this position for 3 seconds. Control the resistance of the band or tubing as your hand returns to your side. Repeat 2 times. Complete this exercise 3 times per week. Exercise O: Forearm Rotation, Supination  Sit with your left / right forearm supported on a table. Keep your elbow at waist height. Rest your hand over  the edge of the table with your palm facing down. Gently hold a lightweight hammer. Without moving your elbow, slowly rotate your forearm to turn your palm and hand upward to a "thumbs-up" position. Hold this position for 3 seconds. Slowly return to the starting position. Repeat 2 times. Complete this exercise 3 times per week. Exercise P: Forearm Rotation, Pronation  Sit with your left / right forearm supported on a table. Keep your elbow below shoulder height. Rest your hand over the edge of the table with your palm facing up. Gently hold a lightweight hammer. Without moving your elbow, slowly rotate your forearm to turn your palm and  hand upward to a "thumbs-up" position. Hold this position for 3 seconds. Slowly return to the starting position. Repeat 2 times. Complete this exercise 3 times per week.  Make sure you discuss any questions you have with your health care provider. Document Released: 11/18/2004 Document Revised: 05/15/2015 Document Reviewed: 09/29/2014 Elsevier Interactive Patient Education  [employer].

## 2021-11-10 ENCOUNTER — Other Ambulatory Visit (INDEPENDENT_AMBULATORY_CARE_PROVIDER_SITE_OTHER): Payer: BC Managed Care – PPO

## 2021-11-10 DIAGNOSIS — R413 Other amnesia: Secondary | ICD-10-CM | POA: Diagnosis not present

## 2021-11-10 LAB — T4, FREE: Free T4: 0.8 ng/dL (ref 0.60–1.60)

## 2021-11-11 ENCOUNTER — Encounter: Payer: Self-pay | Admitting: Family Medicine

## 2021-11-12 DIAGNOSIS — F4323 Adjustment disorder with mixed anxiety and depressed mood: Secondary | ICD-10-CM | POA: Diagnosis not present

## 2021-11-12 DIAGNOSIS — F1420 Cocaine dependence, uncomplicated: Secondary | ICD-10-CM | POA: Diagnosis not present

## 2021-11-16 ENCOUNTER — Other Ambulatory Visit (INDEPENDENT_AMBULATORY_CARE_PROVIDER_SITE_OTHER): Payer: BC Managed Care – PPO

## 2021-11-16 ENCOUNTER — Other Ambulatory Visit: Payer: Self-pay | Admitting: Family Medicine

## 2021-11-16 DIAGNOSIS — E876 Hypokalemia: Secondary | ICD-10-CM

## 2021-11-16 DIAGNOSIS — R7989 Other specified abnormal findings of blood chemistry: Secondary | ICD-10-CM | POA: Diagnosis not present

## 2021-11-16 DIAGNOSIS — E559 Vitamin D deficiency, unspecified: Secondary | ICD-10-CM | POA: Diagnosis not present

## 2021-11-16 LAB — BASIC METABOLIC PANEL
BUN: 15 mg/dL (ref 6–23)
CO2: 30 mEq/L (ref 19–32)
Calcium: 9.4 mg/dL (ref 8.4–10.5)
Chloride: 95 mEq/L — ABNORMAL LOW (ref 96–112)
Creatinine, Ser: 1.05 mg/dL (ref 0.40–1.20)
GFR: 61.04 mL/min (ref >60.00)
Glucose, Bld: 101 mg/dL — ABNORMAL HIGH (ref 70–99)
Potassium: 3.5 mEq/L (ref 3.5–5.1)
Sodium: 133 mEq/L — ABNORMAL LOW (ref 135–145)

## 2021-11-16 LAB — MAGNESIUM: Magnesium: 1.9 mg/dL (ref 1.5–2.5)

## 2021-11-16 LAB — VITAMIN D 25 HYDROXY (VIT D DEFICIENCY, FRACTURES): VITD: 19.26 ng/mL — ABNORMAL LOW (ref 30.00–100.00)

## 2021-11-16 MED ORDER — VITAMIN D (ERGOCALCIFEROL) 1.25 MG (50000 UNIT) PO CAPS: 50000.0000 [IU] | ORAL_CAPSULE | ORAL | 0 refills | Status: DC

## 2021-11-18 ENCOUNTER — Telehealth: Payer: Self-pay | Admitting: Family Medicine

## 2021-11-18 DIAGNOSIS — F411 Generalized anxiety disorder: Secondary | ICD-10-CM

## 2021-11-18 MED ORDER — TRAZODONE HCL 50 MG PO TABS: ORAL_TABLET | ORAL | 2 refills | Status: DC

## 2021-11-18 NOTE — Telephone Encounter (Signed)
Pharmacy called stating patient has been taking 1.5 of her trazoodone at bedtime instad of just 1. They would like to know if the prescription can be changed. Please advise.

## 2021-11-18 NOTE — Telephone Encounter (Signed)
Corrected and sent in.

## 2021-11-19 ENCOUNTER — Encounter: Payer: Self-pay | Admitting: Family Medicine

## 2021-11-19 DIAGNOSIS — F1420 Cocaine dependence, uncomplicated: Secondary | ICD-10-CM | POA: Diagnosis not present

## 2021-11-19 DIAGNOSIS — F4323 Adjustment disorder with mixed anxiety and depressed mood: Secondary | ICD-10-CM | POA: Diagnosis not present

## 2021-11-26 DIAGNOSIS — F4323 Adjustment disorder with mixed anxiety and depressed mood: Secondary | ICD-10-CM | POA: Diagnosis not present

## 2021-11-26 DIAGNOSIS — F1420 Cocaine dependence, uncomplicated: Secondary | ICD-10-CM | POA: Diagnosis not present

## 2021-12-03 DIAGNOSIS — F1420 Cocaine dependence, uncomplicated: Secondary | ICD-10-CM | POA: Diagnosis not present

## 2021-12-03 DIAGNOSIS — F4323 Adjustment disorder with mixed anxiety and depressed mood: Secondary | ICD-10-CM | POA: Diagnosis not present

## 2021-12-14 ENCOUNTER — Encounter: Payer: Self-pay | Admitting: Family Medicine

## 2021-12-14 ENCOUNTER — Ambulatory Visit (INDEPENDENT_AMBULATORY_CARE_PROVIDER_SITE_OTHER): Payer: BC Managed Care – PPO | Admitting: Family Medicine

## 2021-12-14 VITALS — BP 112/68 | HR 73 | Temp 98.1°F | Ht 69.0 in | Wt 152.0 lb

## 2021-12-14 DIAGNOSIS — M7712 Lateral epicondylitis, left elbow: Secondary | ICD-10-CM | POA: Diagnosis not present

## 2021-12-14 DIAGNOSIS — F411 Generalized anxiety disorder: Secondary | ICD-10-CM | POA: Diagnosis not present

## 2021-12-14 MED ORDER — BUSPIRONE HCL 7.5 MG PO TABS: 7.5000 mg | ORAL_TABLET | Freq: Two times a day (BID) | ORAL | 2 refills | Status: DC

## 2021-12-14 NOTE — Patient Instructions (Addendum)
Continue with the counseling team.   Aim to do some physical exertion for 150 minutes per week. This is typically divided into 5 days per week, 30 minutes per day. The activity should be enough to get your heart rate up. Anything is better than nothing if you have time constraints.  Listen to your body.   If you want [place] to set you up with physical therapy.   Let us know if you need anything.

## 2021-12-14 NOTE — Progress Notes (Signed)
Chief Complaint  Patient presents with   Follow-up    Subjective Michelle Potts presents for f/u anxiety.  Pt is currently being treated with Cymbalta 60 mg bid.  Reports 60% improvement since treatment. No thoughts of harming self or others. No self-medication with alcohol, prescription drugs or illicit drugs. Pt is following with a counselor/psychologist.  Elbow pain-patient was diagnosed with lateral epicondylitis of the left at her last visit.  She has been using a wrist splint with some improvement.  Unfortunately, her right elbow is now bothersome in the same location.  No specific injury or change in activity.  Mild swelling over the area without redness or bruising.  Past Medical History:  Diagnosis Date   Allergy    Anemia    Anxiety    Arthritis    Colorectal cancer ([facility]) 2005   Depression    IBS (irritable bowel syndrome)    Osteoporosis    Thyroid disease    Allergies as of 12/14/2021       Reactions   Gadobenate Nausea And Vomiting    MRI contrast media, Multihance induced nausea and vomiting     Codeine Nausea And Vomiting   Iodinated Contrast Media Itching   Itching immediately after contrast injection. Treated with Benadryl onsite. Must be pre-medicated prior to receiving any Iodinated contrast media in the future.        Medication List        Accurate as of December 14, 2021 10:43 AM. If you have any questions, ask your nurse or doctor.          busPIRone 7.5 MG tablet Commonly known as: BUSPAR Take 1 tablet (7.5 mg total) by mouth 2 (two) times daily. Started by: Shelda Pal, DO   DULoxetine 60 MG capsule Commonly known as: Cymbalta Take 1 capsule (60 mg total) by mouth 2 (two) times daily.   LORazepam 1 MG tablet Commonly known as: Ativan Take 0.5-1 tablets (0.5-1 mg total) by mouth every 8 (eight) hours.   traZODone 50 MG tablet Commonly known as: DESYREL Take 1 and 1/2 tablet at bedtime   triamcinolone cream 0.1  % Commonly known as: KENALOG Apply 1 Application topically 2 (two) times daily.   Vitamin D (Ergocalciferol) 1.25 MG (50000 UNIT) Caps capsule Commonly known as: DRISDOL Take 1 capsule (50,000 Units total) by mouth every 7 (seven) days.        Exam BP 112/68 (BP Location: Right Arm, Patient Position: Sitting, Cuff Size: Normal)   Pulse 73   Temp 98.1 F (36.7 C) (Oral)   Ht '5\' 9"'$  (1.753 m)   Wt 152 lb (68.9 kg)   SpO2 96%   BMI 22.45 kg/m  General:  well developed, well nourished, in no apparent distress Lungs:  No respiratory distress MSK: Moderate soft tissue edema over the lateral epicondyle on the right with TTP Psych: well oriented with normal range of affect and age-appropriate judgement/insight, alert and oriented x4.  Assessment and Plan  GAD (generalized anxiety disorder) - Plan: busPIRone (BUSPAR) 7.5 MG tablet  Lateral epicondylitis of left elbow - Plan: Ambulatory referral to Physical Therapy  Chronic, uncontrolled.  Continue Cymbalta 60 mg twice daily.  We will add buspirone 7.5 mg twice daily.  Continue with the counseling team.  Counseled on exercise. It is now affecting the right side, will refer to physical therapy.  Continue bracing. The patient voiced understanding and agreement to the plan.  [place], DO 12/14/21 10:43 AM

## 2021-12-15 ENCOUNTER — Telehealth: Payer: Self-pay | Admitting: Family Medicine

## 2021-12-15 MED ORDER — BUSPIRONE HCL 5 MG PO TABS: ORAL_TABLET | ORAL | 1 refills | Status: DC

## 2021-12-15 NOTE — Telephone Encounter (Signed)
Sent in

## 2021-12-15 NOTE — Telephone Encounter (Signed)
Solomon states ins advised it would be cheaper for the pt to be prescribed '5mg'$  tabs and just take 1 1/2 to get the same amount.   busPIRone (BUSPAR) 7.5 MG tablet   [facility] [address] [place], [place] - [address], [place] [address] Phone: [phone]  Fax: [phone]

## 2021-12-17 ENCOUNTER — Encounter: Payer: Self-pay | Admitting: Family Medicine

## 2021-12-17 DIAGNOSIS — F1420 Cocaine dependence, uncomplicated: Secondary | ICD-10-CM | POA: Diagnosis not present

## 2021-12-17 DIAGNOSIS — F4323 Adjustment disorder with mixed anxiety and depressed mood: Secondary | ICD-10-CM | POA: Diagnosis not present

## 2021-12-18 ENCOUNTER — Encounter: Payer: Self-pay | Admitting: Family Medicine

## 2021-12-18 ENCOUNTER — Ambulatory Visit (INDEPENDENT_AMBULATORY_CARE_PROVIDER_SITE_OTHER): Payer: BC Managed Care – PPO | Admitting: Family Medicine

## 2021-12-18 VITALS — BP 118/62 | HR 81 | Temp 98.1°F | Ht 69.0 in | Wt 152.0 lb

## 2021-12-18 DIAGNOSIS — R0789 Other chest pain: Secondary | ICD-10-CM | POA: Diagnosis not present

## 2021-12-18 DIAGNOSIS — G894 Chronic pain syndrome: Secondary | ICD-10-CM

## 2021-12-18 MED ORDER — METHYLPREDNISOLONE ACETATE 80 MG/ML IJ SUSP
80.0000 mg | Freq: Once | INTRAMUSCULAR | Status: AC
  Administered 2021-12-18: 80 mg via INTRAMUSCULAR

## 2021-12-18 NOTE — Progress Notes (Signed)
Chief Complaint  Patient presents with   leg, arm shoulder, shoulder pain    Subjective: Patient is a 51 y.o. female here for follow-up pain.  Over the last few days, the patient has had increased pain over her chest and head and neck area.  She denies any injury or change in activity.  She does have a history of chronic pain.  She thinks she has fibromyalgia.  She has not been exercising routinely.  She is on Cymbalta 60 mg twice daily for mood issues.  She reports compliance with this and no adverse effects.  Past Medical History:  Diagnosis Date   Allergy    Anemia    Anxiety    Arthritis    Colorectal cancer ([facility]) 2005   Depression    IBS (irritable bowel syndrome)    Osteoporosis    Thyroid disease     Objective: BP 118/62 (BP Location: Right Arm, Patient Position: Sitting, Cuff Size: Normal)   Pulse 81   Temp 98.1 F (36.7 C) (Oral)   Ht '5\' 9"'$  (1.753 m)   Wt 152 lb (68.9 kg)   SpO2 97%   BMI 22.45 kg/m  General: Awake, appears stated age Heart: RRR, no LE edema Lungs: CTAB, no rales, wheezes or rhonchi. No accessory muscle use MSK: Chest pain is reproducible with palpation bilaterally in the upper portion of the chest, there is no crepitus, fluctuance, erythema, or deformity She has tenderness over the paraspinal musculature from the cervical spine down to the lumbar spine in addition to the lateral tender points associate with fibromyalgia historically Psych: Age appropriate judgment and insight, normal affect and mood  Assessment and Plan: Atypical chest pain - Plan: EKG 12-Lead  Chronic pain syndrome - Plan: methylPREDNISolone acetate (DEPO-MEDROL) injection 80 mg  EKG shows NSR, normal axis, no interval abnormalities, no ST segment or T wave changes, good R wave progression.  I did reassure the 95% of chest pain that is reproducible with palpation is noted to do with her heart or lungs. Continue Cymbalta 60 mg twice daily.  Depo-Medrol injection today.  If she  does well, will consider rheumatologic workup.  If she does not do well, will consider adding Lyrica to treat chronic pain syndrome/fibromyalgia.  Counseled on exercise.  Heat, ice, Tylenol, ibuprofen.  Follow-up as originally scheduled in 3 and half weeks. The patient and her mother voiced understanding and agreement to the plan.  [place], DO 12/18/21  4:53 PM

## 2021-12-18 NOTE — Patient Instructions (Signed)
Let me know on Sunday how you did with the injection. This will dictate our next step.   Ice/cold pack over area for 10-15 min twice daily.  Heat (pad or rice pillow in microwave) over affected area, 10-15 minutes twice daily.   Aim to do some physical exertion for 150 minutes per week. This is typically divided into 5 days per week, 30 minutes per day. The activity should be enough to get your heart rate up. Anything is better than nothing if you have time constraints.  Try to get some stretching or aqua-therapy.  Let us know if you need anything.

## 2021-12-20 ENCOUNTER — Encounter: Payer: Self-pay | Admitting: Family Medicine

## 2021-12-21 ENCOUNTER — Other Ambulatory Visit: Payer: Self-pay | Admitting: Family Medicine

## 2021-12-21 MED ORDER — PREGABALIN 25 MG PO CAPS: 25.0000 mg | ORAL_CAPSULE | Freq: Two times a day (BID) | ORAL | 1 refills | Status: DC

## 2021-12-24 DIAGNOSIS — F1420 Cocaine dependence, uncomplicated: Secondary | ICD-10-CM | POA: Diagnosis not present

## 2021-12-24 DIAGNOSIS — F4323 Adjustment disorder with mixed anxiety and depressed mood: Secondary | ICD-10-CM | POA: Diagnosis not present

## 2022-01-07 DIAGNOSIS — F1420 Cocaine dependence, uncomplicated: Secondary | ICD-10-CM | POA: Diagnosis not present

## 2022-01-07 DIAGNOSIS — F4323 Adjustment disorder with mixed anxiety and depressed mood: Secondary | ICD-10-CM | POA: Diagnosis not present

## 2022-01-13 ENCOUNTER — Telehealth (INDEPENDENT_AMBULATORY_CARE_PROVIDER_SITE_OTHER): Payer: BC Managed Care – PPO | Admitting: Family Medicine

## 2022-01-13 ENCOUNTER — Encounter: Payer: Self-pay | Admitting: Family Medicine

## 2022-01-13 DIAGNOSIS — G894 Chronic pain syndrome: Secondary | ICD-10-CM | POA: Diagnosis not present

## 2022-01-13 DIAGNOSIS — F411 Generalized anxiety disorder: Secondary | ICD-10-CM | POA: Diagnosis not present

## 2022-01-13 MED ORDER — LORAZEPAM 1 MG PO TABS: 0.5000 mg | ORAL_TABLET | Freq: Three times a day (TID) | ORAL | 2 refills | Status: DC

## 2022-01-13 MED ORDER — PREGABALIN 25 MG PO CAPS: 25.0000 mg | ORAL_CAPSULE | Freq: Two times a day (BID) | ORAL | 1 refills | Status: DC

## 2022-01-13 NOTE — Progress Notes (Signed)
Chief Complaint  Patient presents with   Arm Pain    Still complaining of pain on left arm   Leg Pain    Still complaining of pain on left leg   Hip Pain    Complains of new left hip pain    Subjective: Patient is a 51 y.o. female here for f/u. Due to COVID-19 pandemic, we are interacting via web portal for an electronic face-to-face visit. I verified patient's ID using 2 identifiers. Patient agreed to proceed with visit via this method. Patient is at home, I am at office. Patient and I are present for visit.   Patient has a history of chronic pain.  It is steadily improved, currently taking Cymbalta 60 mg twice daily.  No adverse effects, she reports compliance.  Past Medical History:  Diagnosis Date   Allergy    Anemia    Anxiety    Arthritis    Colorectal cancer ([facility]) 2005   Depression    IBS (irritable bowel syndrome)    Osteoporosis    Thyroid disease     Objective: No conversational dyspnea Age appropriate judgment and insight Nml affect and mood  Assessment and Plan: Chronic pain syndrome - Plan: pregabalin (LYRICA) 25 MG capsule  GAD (generalized anxiety disorder) - Plan: LORazepam (ATIVAN) 1 MG tablet  Chronic, uncontrolled. Cont Cymbalta 60 mg bid. Start Lyrica 25 mg bid. Counseled on exercise.  F/u in 1 mo.  The patient voiced understanding and agreement to the plan.  [place], DO 01/13/22  10:11 AM

## 2022-01-21 DIAGNOSIS — F1420 Cocaine dependence, uncomplicated: Secondary | ICD-10-CM | POA: Diagnosis not present

## 2022-01-21 DIAGNOSIS — F4323 Adjustment disorder with mixed anxiety and depressed mood: Secondary | ICD-10-CM | POA: Diagnosis not present

## 2022-01-28 ENCOUNTER — Encounter: Payer: Self-pay | Admitting: Family Medicine

## 2022-01-28 DIAGNOSIS — F4323 Adjustment disorder with mixed anxiety and depressed mood: Secondary | ICD-10-CM | POA: Diagnosis not present

## 2022-01-28 DIAGNOSIS — F1420 Cocaine dependence, uncomplicated: Secondary | ICD-10-CM | POA: Diagnosis not present

## 2022-02-11 DIAGNOSIS — F1420 Cocaine dependence, uncomplicated: Secondary | ICD-10-CM | POA: Diagnosis not present

## 2022-02-11 DIAGNOSIS — F4323 Adjustment disorder with mixed anxiety and depressed mood: Secondary | ICD-10-CM | POA: Diagnosis not present

## 2022-02-25 DIAGNOSIS — F1420 Cocaine dependence, uncomplicated: Secondary | ICD-10-CM | POA: Diagnosis not present

## 2022-02-25 DIAGNOSIS — F4323 Adjustment disorder with mixed anxiety and depressed mood: Secondary | ICD-10-CM | POA: Diagnosis not present

## 2022-03-25 DIAGNOSIS — F1420 Cocaine dependence, uncomplicated: Secondary | ICD-10-CM | POA: Diagnosis not present

## 2022-03-25 DIAGNOSIS — F4323 Adjustment disorder with mixed anxiety and depressed mood: Secondary | ICD-10-CM | POA: Diagnosis not present

## 2022-04-13 DIAGNOSIS — F1420 Cocaine dependence, uncomplicated: Secondary | ICD-10-CM | POA: Diagnosis not present

## 2022-04-13 DIAGNOSIS — F4323 Adjustment disorder with mixed anxiety and depressed mood: Secondary | ICD-10-CM | POA: Diagnosis not present

## 2022-04-22 DIAGNOSIS — F4323 Adjustment disorder with mixed anxiety and depressed mood: Secondary | ICD-10-CM | POA: Diagnosis not present

## 2022-04-22 DIAGNOSIS — F1420 Cocaine dependence, uncomplicated: Secondary | ICD-10-CM | POA: Diagnosis not present

## 2022-04-26 ENCOUNTER — Other Ambulatory Visit: Payer: Self-pay | Admitting: Family Medicine

## 2022-04-26 DIAGNOSIS — F411 Generalized anxiety disorder: Secondary | ICD-10-CM

## 2022-04-26 NOTE — Telephone Encounter (Signed)
Last OV---01/13/22- Last RF---01/13/22    #30 with 2 refills

## 2022-05-06 DIAGNOSIS — F4323 Adjustment disorder with mixed anxiety and depressed mood: Secondary | ICD-10-CM | POA: Diagnosis not present

## 2022-05-06 DIAGNOSIS — F1420 Cocaine dependence, uncomplicated: Secondary | ICD-10-CM | POA: Diagnosis not present

## 2022-05-10 ENCOUNTER — Ambulatory Visit: Payer: BC Managed Care – PPO | Admitting: Family Medicine

## 2022-05-12 ENCOUNTER — Encounter: Payer: Self-pay | Admitting: Family Medicine

## 2022-05-12 ENCOUNTER — Ambulatory Visit (INDEPENDENT_AMBULATORY_CARE_PROVIDER_SITE_OTHER): Payer: BC Managed Care – PPO | Admitting: Family Medicine

## 2022-05-12 VITALS — BP 110/80 | HR 86 | Temp 97.9°F | Ht 69.0 in | Wt 147.2 lb

## 2022-05-12 DIAGNOSIS — F411 Generalized anxiety disorder: Secondary | ICD-10-CM

## 2022-05-12 DIAGNOSIS — E039 Hypothyroidism, unspecified: Secondary | ICD-10-CM

## 2022-05-12 DIAGNOSIS — L509 Urticaria, unspecified: Secondary | ICD-10-CM | POA: Diagnosis not present

## 2022-05-12 DIAGNOSIS — G894 Chronic pain syndrome: Secondary | ICD-10-CM | POA: Diagnosis not present

## 2022-05-12 DIAGNOSIS — R5383 Other fatigue: Secondary | ICD-10-CM | POA: Diagnosis not present

## 2022-05-12 DIAGNOSIS — R7989 Other specified abnormal findings of blood chemistry: Secondary | ICD-10-CM

## 2022-05-12 DIAGNOSIS — L300 Nummular dermatitis: Secondary | ICD-10-CM

## 2022-05-12 MED ORDER — TRIAMCINOLONE ACETONIDE 0.1 % EX CREA: 1.0000 | TOPICAL_CREAM | Freq: Two times a day (BID) | CUTANEOUS | 0 refills | Status: DC

## 2022-05-12 NOTE — Patient Instructions (Addendum)
Give [place] 2-3 business days to get the results of your labs back.   Aim to do some physical exertion for 150 minutes per week. This is typically divided into 5 days per week, 30 minutes per day. The activity should be enough to get your heart rate up. Anything is better than nothing if you have time constraints.  If you do not hear anything about your referral in the next 1-2 weeks, call our office and ask for an update.  Claritin (loratadine), Allegra (fexofenadine), Zyrtec (cetirizine) which is also equivalent to Xyzal (levocetirizine); these are listed in order from weakest to strongest. Generic, and therefore cheaper, options are in the parentheses.   There are available OTC, and the generic versions, which may be cheaper, are in parentheses. Show this to a pharmacist if you have trouble finding any of these items.  Let us know if you need anything.

## 2022-05-12 NOTE — Progress Notes (Signed)
Chief Complaint  Patient presents with   grieving    Short of breath after showering Fatigue all the time    Subjective: Patient is a 52 y.o. female here for fu.  Here with mom.  Over the past mo, has had more pain. When she takes a shower, she will get sharp pains in her back like she is being shot with a pellet gun.  She has a history of chronic pain syndrome suspicious for fibromyalgia.  It affects her entire back, extremities equally.  No recent injury or change in activity.  There is no bruising, redness, or swelling.  She denies any fevers.  This past month marks the anniversary of her husband's death.  Her stress levels are quite high.  She is compliant with Cymbalta 60 mg daily.  She was placed on Lyrica historically, but did not like it due to side effects.  She has also noticed a scaly and pink rash on her right elbow.  She historically has had cream for this but thinks she threw it away.  It is itchy.  There is no pain or drainage.  Past Medical History:  Diagnosis Date   Allergy    Anemia    Anxiety    Arthritis    Colorectal cancer 2005   Depression    IBS (irritable bowel syndrome)    Osteoporosis    Thyroid disease     Objective: BP 110/80 (BP Location: Left Arm, Patient Position: Sitting, Cuff Size: Normal)   Pulse 86   Temp 97.9 F (36.6 C) (Oral)   Ht  (1.753 m)   Wt 147 lb 4 oz (66.8 kg)   SpO2 98%   BMI 21.75 kg/m  General: Awake, appears stated age Heart: RRR, no LE edema Skin: Over the proximal dorsum of the right forearm, there is a circular scaly and pink lesion without erythema, fluctuance, or drainage. Lungs: CTAB, no rales, wheezes or rhonchi. No accessory muscle use MSK: TTP along the trapezius, rhomboid, thoracic and lumbar paraspinal musculature bilaterally, musculature of her upper extremities and the musculature of her lower extremities. Neuro: Grip strength adequate, 4/5 strength in the lower extremities, equal strength in the lower  extremities, DTRs equal and symmetric throughout, no clonus, no cerebellar signs Psych: Age appropriate judgment and insight, normal affect and mood  Assessment and Plan: Chronic pain syndrome - Plan: Ambulatory referral to Physical Medicine Rehab  Urticaria  GAD (generalized anxiety disorder)  Low vitamin D level - Plan: VITAMIN D 25 Hydroxy (Vit-D Deficiency, Fractures)  Low vitamin B12 level - Plan: B12  Hypothyroidism, unspecified type - Plan: TSH, T4, free  Fatigue, unspecified type - Plan: CBC, Comprehensive metabolic panel  Chronic, uncontrolled.  Increase Cymbalta from 60 mg daily to 60 mg twice daily.  Refer to physical medicine & rehabilitation.  I will see her in a month and if no improvement, will consider adding gabapentin as a neuromodulator.  Also could consider reducing back to once a day and adding a low-dose TCA.  Heat, ice, Tylenol, physical activity recommended. Will resend in her steroid cream, take an oral antihistamine daily. Cymbalta dosage increased as above While I do think #1 is responsible for many of her symptoms, we will check some labs. The patient and her mother voiced understanding and agreement to the plan.  Jilda Roche [place], DO 05/12/22  4:37 PM

## 2022-05-13 LAB — COMPREHENSIVE METABOLIC PANEL
ALT: 8 U/L (ref 0–35)
AST: 12 U/L (ref 0–37)
Albumin: 4.3 g/dL (ref 3.5–5.2)
Alkaline Phosphatase: 47 U/L (ref 39–117)
BUN: 11 mg/dL (ref 6–23)
CO2: 32 mEq/L (ref 19–32)
Calcium: 9.2 mg/dL (ref 8.4–10.5)
Chloride: 99 mEq/L (ref 96–112)
Creatinine, Ser: 1.21 mg/dL — ABNORMAL HIGH (ref 0.40–1.20)
GFR: 51.31 mL/min — ABNORMAL LOW (ref >60.00)
Glucose, Bld: 87 mg/dL (ref 70–99)
Potassium: 3.9 mEq/L (ref 3.5–5.1)
Sodium: 138 mEq/L (ref 135–145)
Total Bilirubin: 0.3 mg/dL (ref 0.2–1.2)
Total Protein: 7.3 g/dL (ref 6.0–8.3)

## 2022-05-13 LAB — VITAMIN D 25 HYDROXY (VIT D DEFICIENCY, FRACTURES): VITD: 29.62 ng/mL — ABNORMAL LOW (ref 30.00–100.00)

## 2022-05-13 LAB — CBC
HCT: 37.1 % (ref 36.0–46.0)
Hemoglobin: 12.5 g/dL (ref 12.0–15.0)
MCHC: 33.6 g/dL (ref 30.0–36.0)
MCV: 91.3 fl (ref 78.0–100.0)
Platelets: 172 10*3/uL (ref 150.0–400.0)
RBC: 4.06 Mil/uL (ref 3.87–5.11)
RDW: 13.1 % (ref 11.5–15.5)
WBC: 5.4 10*3/uL (ref 4.0–10.5)

## 2022-05-13 LAB — TSH: TSH: 2.22 u[IU]/mL (ref 0.35–5.50)

## 2022-05-13 LAB — VITAMIN B12: Vitamin B-12: 157 pg/mL — ABNORMAL LOW (ref 211–911)

## 2022-05-13 LAB — T4, FREE: Free T4: 0.72 ng/dL (ref 0.60–1.60)

## 2022-05-14 ENCOUNTER — Other Ambulatory Visit: Payer: BC Managed Care – PPO

## 2022-05-14 ENCOUNTER — Other Ambulatory Visit: Payer: Self-pay | Admitting: Family Medicine

## 2022-05-14 DIAGNOSIS — R7989 Other specified abnormal findings of blood chemistry: Secondary | ICD-10-CM | POA: Diagnosis not present

## 2022-05-14 NOTE — Addendum Note (Signed)
Addended by: Mervin Kung A on: 05/14/2022 08:18 AM   Modules accepted: Orders

## 2022-05-18 LAB — INTRINSIC FACTOR ANTIBODIES: Intrinsic Factor: NEGATIVE

## 2022-05-24 ENCOUNTER — Other Ambulatory Visit: Payer: Self-pay | Admitting: Family Medicine

## 2022-06-03 DIAGNOSIS — F1420 Cocaine dependence, uncomplicated: Secondary | ICD-10-CM | POA: Diagnosis not present

## 2022-06-03 DIAGNOSIS — F4323 Adjustment disorder with mixed anxiety and depressed mood: Secondary | ICD-10-CM | POA: Diagnosis not present

## 2022-06-04 ENCOUNTER — Other Ambulatory Visit: Payer: Self-pay | Admitting: Family Medicine

## 2022-06-04 ENCOUNTER — Encounter: Payer: Self-pay | Admitting: Family Medicine

## 2022-06-04 DIAGNOSIS — G894 Chronic pain syndrome: Secondary | ICD-10-CM

## 2022-06-04 MED ORDER — DULOXETINE HCL 60 MG PO CPEP: 60.0000 mg | ORAL_CAPSULE | Freq: Two times a day (BID) | ORAL | 1 refills | Status: DC

## 2022-06-15 ENCOUNTER — Ambulatory Visit (INDEPENDENT_AMBULATORY_CARE_PROVIDER_SITE_OTHER): Payer: BC Managed Care – PPO | Admitting: Family Medicine

## 2022-06-15 ENCOUNTER — Encounter: Payer: Self-pay | Admitting: Family Medicine

## 2022-06-15 VITALS — BP 118/80 | HR 86 | Temp 98.5°F | Ht 69.0 in | Wt 154.5 lb

## 2022-06-15 DIAGNOSIS — L989 Disorder of the skin and subcutaneous tissue, unspecified: Secondary | ICD-10-CM

## 2022-06-15 DIAGNOSIS — R519 Headache, unspecified: Secondary | ICD-10-CM | POA: Diagnosis not present

## 2022-06-15 DIAGNOSIS — G894 Chronic pain syndrome: Secondary | ICD-10-CM | POA: Diagnosis not present

## 2022-06-15 MED ORDER — KETOCONAZOLE 2 % EX CREA
1.0000 | TOPICAL_CREAM | Freq: Every day | CUTANEOUS | 0 refills | Status: DC
Start: 2022-06-15 — End: 2022-07-13

## 2022-06-15 MED ORDER — GABAPENTIN 300 MG PO CAPS
ORAL_CAPSULE | ORAL | 1 refills | Status: DC
Start: 2022-06-15 — End: 2022-07-13

## 2022-06-15 MED ORDER — KETOROLAC TROMETHAMINE 60 MG/2ML IM SOLN
60.0000 mg | Freq: Once | INTRAMUSCULAR | Status: AC
Start: 2022-06-15 — End: 2022-06-15
  Administered 2022-06-15: 60 mg via INTRAMUSCULAR

## 2022-06-15 NOTE — Patient Instructions (Signed)
Aim to do some physical exertion for 150 minutes per week. This is typically divided into 5 days per week, 30 minutes per day. The activity should be enough to get your heart rate up. Anything is better than nothing if you have time constraints.  Heat (pad or rice pillow in microwave) over affected area, 10-15 minutes twice daily.   Ice/cold pack over area for 10-15 min twice daily.  OK to take Tylenol 1000 mg (2 extra strength tabs) or 975 mg (3 regular strength tabs) every 6 hours as needed.  Let us know if you need anything.

## 2022-06-15 NOTE — Progress Notes (Signed)
Chief Complaint  Patient presents with   Follow-up    Subjective: Patient is a 51 y.o. female here for f/u.  Taking Cymbalta 60 mg bid. Reports compliance and no AE's. Reports feeling 80-85% better. Would like something prn when she has her burning b/l body pain. Not exercising routinely. PT appt end of next week.   Headache now.  She has a history of migraines.  Her husband died about a year ago and this is been a stressful time for her.  She has Imitrex which she does not take often.  Past Medical History:  Diagnosis Date   Allergy    Anemia    Anxiety    Arthritis    Colorectal cancer ([facility]) 2005   Depression    IBS (irritable bowel syndrome)    Osteoporosis    Thyroid disease     Objective: BP 118/80 (BP Location: Left Arm, Patient Position: Sitting, Cuff Size: Normal)   Pulse 86   Temp 98.5 F (36.9 C) (Oral)   Ht 5\' 9"  (1.753 m)   Wt 154 lb 8 oz (70.1 kg)   SpO2 95%   BMI 22.82 kg/m  General: Awake, appears stated age MSK: TTP over the trapezius musculature, thoracic and lumbar paraspinal musculature bilaterally, deltoids, lateral triceps, forearm musculature bilaterally, lateral and anterior thighs, and anterior legs bilaterally Lungs: No accessory muscle use Psych: Age appropriate judgment and insight, normal affect and mood  Assessment and Plan: Chronic pain syndrome - Plan: gabapentin (NEURONTIN) 300 MG capsule, ketorolac (TORADOL) injection 60 mg  Skin lesion  Chronic, improved but not fully stable.  Continue Cymbalta 60 mg twice daily.  Add gabapentin 300 mg 3 times daily as needed.  Follow-up in 1 month to recheck.  Counseled on exercise.  PT starts next week. Will treat with an antifungal.  Consider steroid cream versus biopsy if no improvement. The patient voiced understanding and agreement to the plan.  Jilda Roche [place], DO 06/15/22  3:38 PM

## 2022-06-17 DIAGNOSIS — F1420 Cocaine dependence, uncomplicated: Secondary | ICD-10-CM | POA: Diagnosis not present

## 2022-06-17 DIAGNOSIS — F4323 Adjustment disorder with mixed anxiety and depressed mood: Secondary | ICD-10-CM | POA: Diagnosis not present

## 2022-06-24 DIAGNOSIS — M25611 Stiffness of right shoulder, not elsewhere classified: Secondary | ICD-10-CM | POA: Diagnosis not present

## 2022-06-24 DIAGNOSIS — M25651 Stiffness of right hip, not elsewhere classified: Secondary | ICD-10-CM | POA: Diagnosis not present

## 2022-06-24 DIAGNOSIS — G894 Chronic pain syndrome: Secondary | ICD-10-CM | POA: Diagnosis not present

## 2022-06-24 DIAGNOSIS — R29898 Other symptoms and signs involving the musculoskeletal system: Secondary | ICD-10-CM | POA: Diagnosis not present

## 2022-07-01 DIAGNOSIS — F1420 Cocaine dependence, uncomplicated: Secondary | ICD-10-CM | POA: Diagnosis not present

## 2022-07-01 DIAGNOSIS — F4323 Adjustment disorder with mixed anxiety and depressed mood: Secondary | ICD-10-CM | POA: Diagnosis not present

## 2022-07-06 DIAGNOSIS — R29898 Other symptoms and signs involving the musculoskeletal system: Secondary | ICD-10-CM | POA: Diagnosis not present

## 2022-07-06 DIAGNOSIS — G894 Chronic pain syndrome: Secondary | ICD-10-CM | POA: Diagnosis not present

## 2022-07-06 DIAGNOSIS — M25611 Stiffness of right shoulder, not elsewhere classified: Secondary | ICD-10-CM | POA: Diagnosis not present

## 2022-07-06 DIAGNOSIS — M25651 Stiffness of right hip, not elsewhere classified: Secondary | ICD-10-CM | POA: Diagnosis not present

## 2022-07-13 ENCOUNTER — Telehealth: Payer: Self-pay | Admitting: Family Medicine

## 2022-07-13 DIAGNOSIS — G894 Chronic pain syndrome: Secondary | ICD-10-CM

## 2022-07-13 DIAGNOSIS — F1420 Cocaine dependence, uncomplicated: Secondary | ICD-10-CM | POA: Diagnosis not present

## 2022-07-13 DIAGNOSIS — F4323 Adjustment disorder with mixed anxiety and depressed mood: Secondary | ICD-10-CM | POA: Diagnosis not present

## 2022-07-13 DIAGNOSIS — L989 Disorder of the skin and subcutaneous tissue, unspecified: Secondary | ICD-10-CM

## 2022-07-13 MED ORDER — GABAPENTIN 300 MG PO CAPS
ORAL_CAPSULE | ORAL | 1 refills | Status: DC
Start: 2022-07-13 — End: 2022-08-19

## 2022-07-13 MED ORDER — KETOCONAZOLE 2 % EX CREA
1.0000 | TOPICAL_CREAM | Freq: Every day | CUTANEOUS | 0 refills | Status: AC
Start: 2022-07-13 — End: 2022-08-24

## 2022-07-13 NOTE — Telephone Encounter (Signed)
Pt states she never picked up her prescriptions and would like to know if pcp can resend the rx.   ketoconazole (NIZORAL) 2 % cream  gabapentin (NEURONTIN) 300 MG capsule   [facility] [address] [place], [place] - [address], [place] [address] Phone: [phone]  Fax: [phone]

## 2022-07-13 NOTE — Telephone Encounter (Signed)
Sent in and patient made aware. 

## 2022-07-15 DIAGNOSIS — M25651 Stiffness of right hip, not elsewhere classified: Secondary | ICD-10-CM | POA: Diagnosis not present

## 2022-07-15 DIAGNOSIS — G894 Chronic pain syndrome: Secondary | ICD-10-CM | POA: Diagnosis not present

## 2022-07-15 DIAGNOSIS — M25611 Stiffness of right shoulder, not elsewhere classified: Secondary | ICD-10-CM | POA: Diagnosis not present

## 2022-07-15 DIAGNOSIS — R29898 Other symptoms and signs involving the musculoskeletal system: Secondary | ICD-10-CM | POA: Diagnosis not present

## 2022-07-16 ENCOUNTER — Ambulatory Visit: Payer: BC Managed Care – PPO | Admitting: Family Medicine

## 2022-07-29 DIAGNOSIS — F1420 Cocaine dependence, uncomplicated: Secondary | ICD-10-CM | POA: Diagnosis not present

## 2022-07-29 DIAGNOSIS — F4323 Adjustment disorder with mixed anxiety and depressed mood: Secondary | ICD-10-CM | POA: Diagnosis not present

## 2022-08-02 ENCOUNTER — Other Ambulatory Visit: Payer: Self-pay | Admitting: Family Medicine

## 2022-08-02 ENCOUNTER — Telehealth: Payer: Self-pay | Admitting: Family Medicine

## 2022-08-02 DIAGNOSIS — F411 Generalized anxiety disorder: Secondary | ICD-10-CM

## 2022-08-02 MED ORDER — TRAZODONE HCL 50 MG PO TABS
50.0000 mg | ORAL_TABLET | Freq: Every evening | ORAL | 2 refills | Status: DC | PRN
Start: 2022-08-02 — End: 2023-01-31

## 2022-08-02 NOTE — Telephone Encounter (Signed)
My mistake, I believe I was under the assumption she was no longer taking it. Refill sent. Ty..

## 2022-08-02 NOTE — Telephone Encounter (Signed)
Pt called to follow up on why her trazedone was denied. After reviewing chart, noted that Dr. Carmelia Roller had discontinued this medication on [facility]V on 12.27.23 and did not replace it with a different medication. Pt would like a call back with why it was discontinued as she is unsure the reasoning.

## 2022-08-02 NOTE — Telephone Encounter (Signed)
Patient informed. 

## 2022-08-05 DIAGNOSIS — G894 Chronic pain syndrome: Secondary | ICD-10-CM | POA: Diagnosis not present

## 2022-08-05 DIAGNOSIS — M25611 Stiffness of right shoulder, not elsewhere classified: Secondary | ICD-10-CM | POA: Diagnosis not present

## 2022-08-05 DIAGNOSIS — R29898 Other symptoms and signs involving the musculoskeletal system: Secondary | ICD-10-CM | POA: Diagnosis not present

## 2022-08-05 DIAGNOSIS — M25651 Stiffness of right hip, not elsewhere classified: Secondary | ICD-10-CM | POA: Diagnosis not present

## 2022-08-12 DIAGNOSIS — F4323 Adjustment disorder with mixed anxiety and depressed mood: Secondary | ICD-10-CM | POA: Diagnosis not present

## 2022-08-12 DIAGNOSIS — F1420 Cocaine dependence, uncomplicated: Secondary | ICD-10-CM | POA: Diagnosis not present

## 2022-08-19 ENCOUNTER — Encounter: Payer: Self-pay | Admitting: Family Medicine

## 2022-08-19 ENCOUNTER — Ambulatory Visit (INDEPENDENT_AMBULATORY_CARE_PROVIDER_SITE_OTHER): Payer: BC Managed Care – PPO | Admitting: Family Medicine

## 2022-08-19 DIAGNOSIS — R29898 Other symptoms and signs involving the musculoskeletal system: Secondary | ICD-10-CM | POA: Diagnosis not present

## 2022-08-19 DIAGNOSIS — M25651 Stiffness of right hip, not elsewhere classified: Secondary | ICD-10-CM | POA: Diagnosis not present

## 2022-08-19 DIAGNOSIS — Z79899 Other long term (current) drug therapy: Secondary | ICD-10-CM

## 2022-08-19 DIAGNOSIS — M25612 Stiffness of left shoulder, not elsewhere classified: Secondary | ICD-10-CM | POA: Diagnosis not present

## 2022-08-19 DIAGNOSIS — M25611 Stiffness of right shoulder, not elsewhere classified: Secondary | ICD-10-CM | POA: Diagnosis not present

## 2022-08-19 DIAGNOSIS — G894 Chronic pain syndrome: Secondary | ICD-10-CM

## 2022-08-19 DIAGNOSIS — L309 Dermatitis, unspecified: Secondary | ICD-10-CM

## 2022-08-19 LAB — SEDIMENTATION RATE: Sed Rate: 10 mm/hr (ref 0–30)

## 2022-08-19 LAB — URINALYSIS, MICROSCOPIC ONLY

## 2022-08-19 LAB — URIC ACID: Uric Acid, Serum: 4.8 mg/dL (ref 2.4–7.0)

## 2022-08-19 MED ORDER — GABAPENTIN 400 MG PO CAPS: 400.0000 mg | ORAL_CAPSULE | Freq: Three times a day (TID) | ORAL | 1 refills | Status: DC

## 2022-08-19 MED ORDER — TRIAMCINOLONE ACETONIDE 0.1 % EX CREA: 1.0000 | TOPICAL_CREAM | Freq: Two times a day (BID) | CUTANEOUS | 0 refills | Status: DC

## 2022-08-19 NOTE — Progress Notes (Signed)
Chief Complaint  Patient presents with   Follow-up    Having a reaction  Elbow is swollen    Subjective: Patient is a 52 y.o. female here for f/u pain.  She is here with her mother.  A few months ago, she was started on gabapentin 300 mg 3 times daily.  She reports compliance and no adverse effects.  It has helped with her burning sensation and overall pain.  She continues to take Cymbalta 60 mg twice daily.  No adverse effects, reports compliance with this as well.  Over the past couple weeks, she has been having itching and various rashes on her legs and arms.  There is no pain, drainage, or new lotions, soaps, topicals, or detergents.  Past Medical History:  Diagnosis Date   Allergy    Anemia    Anxiety    Arthritis    Colorectal cancer ([facility]) 2005   Depression    IBS (irritable bowel syndrome)    Osteoporosis    Thyroid disease     Objective: BP 136/80 (BP Location: Left Arm, Patient Position: Sitting, Cuff Size: Normal)   Pulse 85   Temp 98.3 F (36.8 C) (Oral)   Ht 5\' 8"  (1.727 m)   Wt 171 lb (77.6 kg)   SpO2 96%   BMI 26.00 kg/m  General: Awake, appears stated age Heart: RRR, no LE edema Lungs: CTAB, no rales, wheezes or rhonchi. No accessory muscle use Skin: Dry skin throughout; pinkish patches with slight scale on the lower extremities and upper extremities bilaterally; no excessive warmth, drainage, fluctuance, TTP Psych: Age appropriate judgment and insight, normal affect and mood  Assessment and Plan: Chronic pain syndrome - Plan: Anti-Smith antibody, Sedimentation rate, Uric acid, Urine Microscopic Only, Aldolase, ANA,IFA RA Diag Pnl w/rflx Tit/Patn, Anti-DNA antibody, double-stranded, Cyclic citrul peptide antibody, IgG (QUEST), Rheumatoid Factor, Sjogrens syndrome-A extractable nuclear antibody, Sjogrens syndrome-B extractable nuclear antibody, CANCELED: Cyclic citrul peptide antibody, IgG (QUEST), CANCELED: Rheumatoid Factor, CANCELED: Anti-DNA antibody,  double-stranded, CANCELED: ANA,IFA RA Diag Pnl w/rflx Tit/Patn, CANCELED: Sjogrens syndrome-A extractable nuclear antibody, CANCELED: Sjogrens syndrome-B extractable nuclear antibody, CANCELED: Aldolase  Dermatitis  Encounter for long-term (current) use of high-risk medication - Plan: Drug Monitoring Panel [phone] , Urine  Chronic, not fully controlled.  Continue Cymbalta 60 mg twice daily, increase gabapentin from 300 mg 3 times daily to 40 mg 3 times daily.  Check autoimmune labs.  Remote history of rheumatoid arthritis and lupus in grandmother. Avoid scented products, cool compresses, avoid scratching, oral antihistamines, topical steroid. UDS and CSA updated today. The patient and her mother voiced understanding and agreement to the plan.  Jilda Roche [place], DO 08/19/22  12:06 PM

## 2022-08-19 NOTE — Patient Instructions (Addendum)
Give [place] 2-3 business days to get the results of your labs back.   Try not to scratch as this can make things worse. Avoid scented products while dealing with this. You may resume when the itchiness resolves. Cold/cool compresses can help.   Claritin (loratadine), Allegra (fexofenadine), Zyrtec (cetirizine) which is also equivalent to Xyzal (levocetirizine); these are listed in order from weakest to strongest. Generic, and therefore cheaper, options are in the parentheses.   There are available OTC, and the generic versions, which may be cheaper, are in parentheses. Show this to a pharmacist if you have trouble finding any of these items.  Heat (pad or rice pillow in microwave) over affected area, 10-15 minutes twice daily.   Ice/cold pack over area for 10-15 min twice daily.  OK to take Tylenol 1000 mg (2 extra strength tabs) or 975 mg (3 regular strength tabs) every 6 hours as needed.  Pectoralis Major Rehab Ask your health care provider which exercises are safe for you. Do exercises exactly as told by your health care provider and adjust them as directed. It is normal to feel mild stretching, pulling, tightness, or discomfort as you do these exercises, but you should stop right away if you feel sudden pain or your pain gets worse. Do not begin these exercises until told by your health care provider. Stretching and range of motion exercises These exercises warm up your muscles and joints and improve the movement and flexibility of your shoulder. These exercises can also help to relieve pain, numbness, and tingling. Exercise A: Pendulum  Stand near a wall or a surface that you can hold onto for balance. Bend at the waist and let your left / right arm hang straight down. Use your other arm to keep your balance. Relax your arm and shoulder muscles, and move your hips and your trunk so your left / right arm swings freely. Your arm should swing because of the motion of your body, not because you  are using your arm or shoulder muscles. Keep moving so your arm swings in the following directions, as told by your health care provider: Side to side. Forward and backward. In clockwise and counterclockwise circles. Slowly return to the starting position. Repeat 2 times. Complete this exercise 3 times per week. Exercise B: Abduction, standing Stand and hold a broomstick, a cane, or a similar object. Place your hands a little more than shoulder-width apart on the object. Your left / right hand should be palm-up, and your other hand should be palm-down. While keeping your elbow straight and your shoulder muscles relaxed, push the stick across your body toward your left / right side. Raise your left / right arm to the side of your body and then over your head until you feel a stretch in your shoulder. Stop when you reach the angle that is recommended by your health care provider. Avoid shrugging your shoulder while you raise your arm. Keep your shoulder blade tucked down toward the middle of your spine. Hold for 10 seconds. Slowly return to the starting position. Repeat 2 times. Complete this exercise 3 times per week. Exercise C: Wand flexion, supine  Lie on your back. You may bend your knees for comfort. Hold a broomstick, a cane, or a similar object so that your hands are about shoulder-width apart on the object. Your palms should face toward your feet. Raise your left / right arm in front of your face, then behind your head (toward the floor). Use your other  hand to help you do this. Stop when you feel a gentle stretch in your shoulder, or when you reach the angle that is recommended by your health care provider. Hold for 3 seconds. Use the broomstick and your other arm to help you return your left / right arm to the starting position. Repeat 2 times. Complete this exercise 3 times per week. Exercise D: Wand shoulder external rotation Stand and hold a broomstick, a cane, or a similar object  so your hands are about shoulder-width apart on the object. Start with your arms hanging down, then bend both elbows to an "L" shape (90 degrees). Keep your left / right elbow at your side. Use your other hand to push the stick so your left / right forearm moves away from your body, out to your side. Keep your left / right elbow bent to 90 degrees and keep it against your side. Stop when you feel a gentle stretch in your shoulder, or when you reach the angle recommended by your health care provider. Hold for 10 seconds. Use the stick to help you return your left / right arm to the starting position. Repeat 2 times. Complete this exercise 3 times per week. Strengthening exercises These exercises build strength and endurance in your shoulder. Endurance is the ability to use your muscles for a long time, even after your muscles get tired. Exercise E: Scapular protraction, standing Stand so you are facing a wall. Place your feet about one arm-length away from the wall. Place your hands on the wall and straighten your elbows. Keep your hands on the wall as you push your upper back away from the wall. You should feel your shoulder blades sliding forward. Keep your elbows and your head still. If you are not sure that you are doing this exercise correctly, ask your health care provider for more instructions. Hold for 3 seconds. Slowly return to the starting position. Let your muscles relax completely before you repeat this exercise. Repeat 2 times. Complete this exercise 3 times per week. Exercise F: Shoulder blade squeezes  (scapular retraction) Sit with good posture in a stable chair. Do not let your back touch the back of the chair. Your arms should be at your sides with your elbows bent. You may rest your forearms on a pillow if that is more comfortable. Squeeze your shoulder blades together. Bring them down and back. Keep your shoulders level. Do not lift your shoulders up toward your  ears. Hold for 3 seconds. Return to the starting position. Repeat 2 times. Complete this exercise 3 times per week. This information is not intended to replace advice given to you by your health care provider. Make sure you discuss any questions you have with your health care provider. Document Released: 01/04/2005 Document Revised: 10/16/2015 Document Reviewed: 09/22/2014 Elsevier Interactive Patient Education  [employer].

## 2022-08-26 DIAGNOSIS — F1420 Cocaine dependence, uncomplicated: Secondary | ICD-10-CM | POA: Diagnosis not present

## 2022-08-26 DIAGNOSIS — F4323 Adjustment disorder with mixed anxiety and depressed mood: Secondary | ICD-10-CM | POA: Diagnosis not present

## 2022-08-30 ENCOUNTER — Encounter: Payer: Self-pay | Admitting: Family Medicine

## 2022-09-09 DIAGNOSIS — F4323 Adjustment disorder with mixed anxiety and depressed mood: Secondary | ICD-10-CM | POA: Diagnosis not present

## 2022-09-23 DIAGNOSIS — F4323 Adjustment disorder with mixed anxiety and depressed mood: Secondary | ICD-10-CM | POA: Diagnosis not present

## 2022-09-28 ENCOUNTER — Other Ambulatory Visit: Payer: Self-pay | Admitting: Family Medicine

## 2022-09-28 ENCOUNTER — Encounter: Payer: Self-pay | Admitting: Family Medicine

## 2022-09-28 DIAGNOSIS — M2550 Pain in unspecified joint: Secondary | ICD-10-CM

## 2022-10-07 DIAGNOSIS — F4323 Adjustment disorder with mixed anxiety and depressed mood: Secondary | ICD-10-CM | POA: Diagnosis not present

## 2022-10-25 DIAGNOSIS — F4323 Adjustment disorder with mixed anxiety and depressed mood: Secondary | ICD-10-CM | POA: Diagnosis not present

## 2022-11-04 DIAGNOSIS — F4323 Adjustment disorder with mixed anxiety and depressed mood: Secondary | ICD-10-CM | POA: Diagnosis not present

## 2022-11-10 ENCOUNTER — Other Ambulatory Visit: Payer: Self-pay | Admitting: Family Medicine

## 2022-11-10 DIAGNOSIS — F411 Generalized anxiety disorder: Secondary | ICD-10-CM

## 2022-11-10 NOTE — Telephone Encounter (Signed)
Last RF--05/16/22--#30 with 5 refills Last OV--08/19/22

## 2022-11-17 DIAGNOSIS — F4323 Adjustment disorder with mixed anxiety and depressed mood: Secondary | ICD-10-CM | POA: Diagnosis not present

## 2022-11-29 DIAGNOSIS — H25813 Combined forms of age-related cataract, bilateral: Secondary | ICD-10-CM | POA: Diagnosis not present

## 2022-11-29 DIAGNOSIS — H52223 Regular astigmatism, bilateral: Secondary | ICD-10-CM | POA: Diagnosis not present

## 2022-11-29 DIAGNOSIS — H5052 Exophoria: Secondary | ICD-10-CM | POA: Diagnosis not present

## 2022-11-29 DIAGNOSIS — H40013 Open angle with borderline findings, low risk, bilateral: Secondary | ICD-10-CM | POA: Diagnosis not present

## 2022-11-30 ENCOUNTER — Other Ambulatory Visit: Payer: Self-pay | Admitting: Family Medicine

## 2022-12-02 DIAGNOSIS — F4323 Adjustment disorder with mixed anxiety and depressed mood: Secondary | ICD-10-CM | POA: Diagnosis not present

## 2022-12-06 DIAGNOSIS — H25813 Combined forms of age-related cataract, bilateral: Secondary | ICD-10-CM | POA: Diagnosis not present

## 2022-12-07 DIAGNOSIS — F4323 Adjustment disorder with mixed anxiety and depressed mood: Secondary | ICD-10-CM | POA: Diagnosis not present

## 2022-12-09 ENCOUNTER — Other Ambulatory Visit: Payer: Self-pay | Admitting: Family Medicine

## 2022-12-09 DIAGNOSIS — F411 Generalized anxiety disorder: Secondary | ICD-10-CM

## 2022-12-09 NOTE — Telephone Encounter (Signed)
Requesting: lorazepam 1mg   Contract: 08/19/22 UDS:  08/19/22 Last Visit: 08/19/22 Next Visit: None Last Refill: 11/10/22 #30 and 0RF   Please Advise

## 2022-12-14 DIAGNOSIS — Z91041 Radiographic dye allergy status: Secondary | ICD-10-CM | POA: Diagnosis not present

## 2022-12-14 DIAGNOSIS — F419 Anxiety disorder, unspecified: Secondary | ICD-10-CM | POA: Diagnosis not present

## 2022-12-14 DIAGNOSIS — H25811 Combined forms of age-related cataract, right eye: Secondary | ICD-10-CM | POA: Insufficient documentation

## 2022-12-14 DIAGNOSIS — Z885 Allergy status to narcotic agent status: Secondary | ICD-10-CM | POA: Diagnosis not present

## 2022-12-14 DIAGNOSIS — Z79899 Other long term (current) drug therapy: Secondary | ICD-10-CM | POA: Diagnosis not present

## 2022-12-14 DIAGNOSIS — H2511 Age-related nuclear cataract, right eye: Secondary | ICD-10-CM | POA: Diagnosis not present

## 2022-12-23 DIAGNOSIS — H5052 Exophoria: Secondary | ICD-10-CM | POA: Diagnosis not present

## 2022-12-23 DIAGNOSIS — H25812 Combined forms of age-related cataract, left eye: Secondary | ICD-10-CM | POA: Diagnosis not present

## 2022-12-23 DIAGNOSIS — H40013 Open angle with borderline findings, low risk, bilateral: Secondary | ICD-10-CM | POA: Diagnosis not present

## 2022-12-23 DIAGNOSIS — Z79899 Other long term (current) drug therapy: Secondary | ICD-10-CM | POA: Diagnosis not present

## 2022-12-23 DIAGNOSIS — Z87891 Personal history of nicotine dependence: Secondary | ICD-10-CM | POA: Diagnosis not present

## 2022-12-23 DIAGNOSIS — H25813 Combined forms of age-related cataract, bilateral: Secondary | ICD-10-CM | POA: Diagnosis not present

## 2022-12-23 DIAGNOSIS — H2512 Age-related nuclear cataract, left eye: Secondary | ICD-10-CM | POA: Diagnosis not present

## 2022-12-23 DIAGNOSIS — Z9221 Personal history of antineoplastic chemotherapy: Secondary | ICD-10-CM | POA: Diagnosis not present

## 2022-12-23 DIAGNOSIS — Z885 Allergy status to narcotic agent status: Secondary | ICD-10-CM | POA: Diagnosis not present

## 2022-12-23 DIAGNOSIS — Z85038 Personal history of other malignant neoplasm of large intestine: Secondary | ICD-10-CM | POA: Diagnosis not present

## 2022-12-23 DIAGNOSIS — H52223 Regular astigmatism, bilateral: Secondary | ICD-10-CM | POA: Diagnosis not present

## 2022-12-23 DIAGNOSIS — Z91048 Other nonmedicinal substance allergy status: Secondary | ICD-10-CM | POA: Diagnosis not present

## 2023-01-27 DIAGNOSIS — F4323 Adjustment disorder with mixed anxiety and depressed mood: Secondary | ICD-10-CM | POA: Diagnosis not present

## 2023-01-30 ENCOUNTER — Other Ambulatory Visit: Payer: Self-pay | Admitting: Family Medicine

## 2023-01-30 DIAGNOSIS — F411 Generalized anxiety disorder: Secondary | ICD-10-CM

## 2023-02-10 DIAGNOSIS — F4323 Adjustment disorder with mixed anxiety and depressed mood: Secondary | ICD-10-CM | POA: Diagnosis not present

## 2023-03-02 DIAGNOSIS — F4323 Adjustment disorder with mixed anxiety and depressed mood: Secondary | ICD-10-CM | POA: Diagnosis not present

## 2023-03-04 ENCOUNTER — Other Ambulatory Visit: Payer: Self-pay | Admitting: Family Medicine

## 2023-03-17 DIAGNOSIS — F4323 Adjustment disorder with mixed anxiety and depressed mood: Secondary | ICD-10-CM | POA: Diagnosis not present

## 2023-03-24 DIAGNOSIS — F4323 Adjustment disorder with mixed anxiety and depressed mood: Secondary | ICD-10-CM | POA: Diagnosis not present

## 2023-04-03 ENCOUNTER — Other Ambulatory Visit: Payer: Self-pay | Admitting: Family Medicine

## 2023-04-03 DIAGNOSIS — F411 Generalized anxiety disorder: Secondary | ICD-10-CM

## 2023-04-06 DIAGNOSIS — F4323 Adjustment disorder with mixed anxiety and depressed mood: Secondary | ICD-10-CM | POA: Diagnosis not present

## 2023-04-06 NOTE — Progress Notes (Unsigned)
 Office Visit Note  Patient: Michelle Potts             Date of Birth: Jun 07, 1970           MRN: [id number]             PCP: Sharlene Dory, DO Referring: Sharlene Dory* Visit Date: 04/07/2023 Occupation: @GUAROCC @  Subjective:  No chief complaint on file.   History of Present Illness: Michelle Potts is a 53 y.o. female ***     Activities of Daily Living:  Patient reports morning stiffness for *** {minute/hour:19697}.   Patient {ACTIONS;DENIES/REPORTS:21021675::"Denies"} nocturnal pain.  Difficulty dressing/grooming: {ACTIONS;DENIES/REPORTS:21021675::"Denies"} Difficulty climbing stairs: {ACTIONS;DENIES/REPORTS:21021675::"Denies"} Difficulty getting out of chair: {ACTIONS;DENIES/REPORTS:21021675::"Denies"} Difficulty using hands for taps, buttons, cutlery, and/or writing: {ACTIONS;DENIES/REPORTS:21021675::"Denies"}  No Rheumatology ROS completed.   PMFS History:  Patient Active Problem List   Diagnosis Date Noted   Chronic pain syndrome 12/18/2021   GAD (generalized anxiety disorder) 11/07/2020   Aortic atherosclerosis (HCC) 06/10/2020   Situational anxiety 03/04/2020   Abdominal cramping 03/04/2020   History of colon cancer 03/04/2020   Right ankle injury 09/07/2013    Past Medical History:  Diagnosis Date   Allergy    Anemia    Anxiety    Arthritis    Colorectal cancer (HCC) 2005   Depression    IBS (irritable bowel syndrome)    Osteoporosis    Thyroid disease     Family History  Problem Relation Age of Onset   Stroke Father    Colon cancer Neg Hx    Esophageal cancer Neg Hx    Rectal cancer Neg Hx    Stomach cancer Neg Hx    Past Surgical History:  Procedure Laterality Date   ABDOMINAL HYSTERECTOMY     APPENDECTOMY     BOWEL RESECTION     CHOLECYSTECTOMY     COLON SURGERY     COLONOSCOPY  06/24/2014   [facility] Everywhere   ESOPHAGOGASTRODUODENOSCOPY  05/17/2013   [facility] Everywhere   UPPER GASTROINTESTINAL  ENDOSCOPY     Social History   Social History Narrative   Not on file   Immunization History  Administered Date(s) Administered   Influenza,inj,Quad PF,6+ Mos 11/01/2017, 09/29/2018, 11/09/2021   Tdap 06/10/2020     Objective: Vital Signs: There were no vitals taken for this visit.   Physical Exam   Musculoskeletal Exam: ***  CDAI Exam: CDAI Score: -- Patient Global: --; Provider Global: -- Swollen: --; Tender: -- Joint Exam 04/07/2023   No joint exam has been documented for this visit   There is currently no information documented on the homunculus. Go to the Rheumatology activity and complete the homunculus joint exam.  Investigation: No additional findings.  Imaging: No results found.  Recent Labs: Lab Results  Component Value Date   WBC 5.4 05/12/2022   HGB 12.5 05/12/2022   PLT 172.0 05/12/2022   NA 138 05/12/2022   K 3.9 05/12/2022   CL 99 05/12/2022   CO2 32 05/12/2022   GLUCOSE 87 05/12/2022   BUN 11 05/12/2022   CREATININE 1.21 (H) 05/12/2022   BILITOT 0.3 05/12/2022   ALKPHOS 47 05/12/2022   AST 12 05/12/2022   ALT 8 05/12/2022   PROT 7.3 05/12/2022   ALBUMIN 4.3 05/12/2022   CALCIUM 9.2 05/12/2022    Speciality Comments: No specialty comments available.  Procedures:  No procedures performed Allergies: Gadobenate, Codeine, and Iodinated contrast media   Assessment / Plan:     Visit  Diagnoses: No diagnosis found.  Orders: No orders of the defined types were placed in this encounter.  No orders of the defined types were placed in this encounter.   Face-to-face time spent with patient was *** minutes. Greater than 50% of time was spent in counseling and coordination of care.  Follow-Up Instructions: No follow-ups on file.   Fuller Plan, MD  Note - This record has been created using [employer].  Chart creation errors have been sought, but may not always  have been located. Such creation errors do not reflect on  the  standard of medical care.

## 2023-04-07 ENCOUNTER — Ambulatory Visit: Payer: BC Managed Care – PPO | Attending: Internal Medicine | Admitting: Internal Medicine

## 2023-04-07 ENCOUNTER — Encounter: Payer: Self-pay | Admitting: Internal Medicine

## 2023-04-07 VITALS — BP 112/78 | HR 77 | Resp 14 | Ht 68.5 in | Wt 165.0 lb

## 2023-04-07 DIAGNOSIS — R768 Other specified abnormal immunological findings in serum: Secondary | ICD-10-CM | POA: Insufficient documentation

## 2023-04-07 DIAGNOSIS — J302 Other seasonal allergic rhinitis: Secondary | ICD-10-CM | POA: Insufficient documentation

## 2023-04-07 NOTE — Patient Instructions (Addendum)
 I recommend adding an oral antihistamine for her sinus inflammation as well as the topical medication for skin rashes  I recommend checking out the [facility] [place] patient-centered guide for fibromyalgia and chronic pain management: [email or web address]

## 2023-04-09 LAB — SEDIMENTATION RATE: Sed Rate: 25 mm/h (ref 0–30)

## 2023-04-09 LAB — C3 AND C4
C3 Complement: 147 mg/dL (ref 83–193)
C4 Complement: 23 mg/dL (ref 15–57)

## 2023-04-09 LAB — RNP ANTIBODY: Ribonucleic Protein(ENA) Antibody, IgG: <1 AI

## 2023-04-09 LAB — RHEUMATOID FACTOR: Rheumatoid fact SerPl-aCnc: 17 [IU]/mL — ABNORMAL HIGH (ref <14)

## 2023-04-18 ENCOUNTER — Encounter: Payer: Self-pay | Admitting: Internal Medicine

## 2023-04-21 DIAGNOSIS — F4323 Adjustment disorder with mixed anxiety and depressed mood: Secondary | ICD-10-CM | POA: Diagnosis not present

## 2023-05-05 DIAGNOSIS — F4323 Adjustment disorder with mixed anxiety and depressed mood: Secondary | ICD-10-CM | POA: Diagnosis not present

## 2023-05-28 ENCOUNTER — Other Ambulatory Visit: Payer: Self-pay | Admitting: Family Medicine

## 2023-05-28 DIAGNOSIS — F411 Generalized anxiety disorder: Secondary | ICD-10-CM

## 2023-06-01 ENCOUNTER — Other Ambulatory Visit: Payer: Self-pay | Admitting: Family Medicine

## 2023-06-01 ENCOUNTER — Telehealth: Payer: Self-pay

## 2023-06-01 NOTE — Telephone Encounter (Signed)
 Called pt was advised needed a follow up apptand  Med check. Appt scheduled.

## 2023-06-02 DIAGNOSIS — F4323 Adjustment disorder with mixed anxiety and depressed mood: Secondary | ICD-10-CM | POA: Diagnosis not present

## 2023-06-08 ENCOUNTER — Ambulatory Visit: Admitting: Family Medicine

## 2023-06-09 ENCOUNTER — Telehealth: Payer: Self-pay | Admitting: Family Medicine

## 2023-06-09 NOTE — Telephone Encounter (Signed)
 Copied from CRM [id number]. Topic: Appointments - Appointment Info/Confirmation >> Jun 09, 2023  1:55 PM Zipporah Him wrote: Patient states she has not been feeling well, having extreme vertigo, and not comfortable driving. Her mother usually would take her but she just had surgery, requested virtual appoitment, confirmed ok with CAL, switched patient to virtual visit same time and date.

## 2023-06-10 ENCOUNTER — Encounter: Payer: Self-pay | Admitting: Family Medicine

## 2023-06-10 ENCOUNTER — Telehealth (INDEPENDENT_AMBULATORY_CARE_PROVIDER_SITE_OTHER): Admitting: Family Medicine

## 2023-06-10 ENCOUNTER — Ambulatory Visit: Admitting: Family Medicine

## 2023-06-10 DIAGNOSIS — F321 Major depressive disorder, single episode, moderate: Secondary | ICD-10-CM | POA: Diagnosis not present

## 2023-06-10 DIAGNOSIS — Z1231 Encounter for screening mammogram for malignant neoplasm of breast: Secondary | ICD-10-CM

## 2023-06-10 DIAGNOSIS — G894 Chronic pain syndrome: Secondary | ICD-10-CM | POA: Diagnosis not present

## 2023-06-10 DIAGNOSIS — F411 Generalized anxiety disorder: Secondary | ICD-10-CM

## 2023-06-10 MED ORDER — PROPRANOLOL HCL 10 MG PO TABS: ORAL_TABLET | ORAL | 1 refills | Status: DC

## 2023-06-10 MED ORDER — BUPROPION HCL ER (XL) 150 MG PO TB24: 150.0000 mg | ORAL_TABLET | Freq: Every day | ORAL | 1 refills | Status: DC

## 2023-06-10 NOTE — Progress Notes (Signed)
 Chief Complaint  Patient presents with   Medication Refill    Discuss Medication    Subjective Michelle Potts presents for f/u anxiety/depression. We are interacting via web portal for an electronic face-to-face visit. I verified patient's ID using 2 identifiers. Patient agreed to proceed with visit via this method. Patient is at home, I am at office. Patient and I are present for visit.   Pt is currently being treated with Cymbalta  60 mg bid.  Reports struggling with depression since treatment. 2 yrs ago her spouse passed away and this has been tough for her.  Nights are the worst for her.  No thoughts of harming self or others. No self-medication with alcohol, prescription drugs or illicit drugs. Pt is following with a counselor/psychologist.  Past Medical History:  Diagnosis Date   Allergy    Anemia    Anxiety    Arthritis    Colorectal cancer (HCC) 2005   Depression    IBS (irritable bowel syndrome)    Osteoporosis    Thyroid  disease    Allergies as of 06/10/2023       Reactions   Gadobenate Nausea And Vomiting    MRI contrast media, Multihance  induced nausea and vomiting     Codeine Nausea And Vomiting   Iodinated Contrast Media Itching   Itching immediately after contrast injection. Treated with Benadryl onsite. Must be pre-medicated prior to receiving any Iodinated contrast media in the future.        Medication List        Accurate as of Jun 10, 2023 11:13 AM. If you have any questions, ask your nurse or doctor.          STOP taking these medications    gabapentin  400 MG capsule Commonly known as: Neurontin    LORazepam  1 MG tablet Commonly known as: ATIVAN    triamcinolone  cream 0.1 % Commonly known as: KENALOG        TAKE these medications    buPROPion 150 MG 24 hr tablet Commonly known as: Wellbutrin XL Take 1 tablet (150 mg total) by mouth daily.   DULoxetine  60 MG capsule Commonly known as: CYMBALTA  Take 1 capsule by mouth twice daily    Melatonin 5 MG Caps Take 3 capsules by mouth at bedtime.   propranolol 10 MG tablet Commonly known as: INDERAL Take 1 tab by mouth 3 times daily as needed for anxiety/panic attacks.   traZODone  50 MG tablet Commonly known as: DESYREL  TAKE 1 TO 1 & 1/2 (ONE TO ONE & ONE-HALF) TABLETS BY MOUTH AT BEDTIME AS NEEDED FOR SLEEP        Exam No conversational dyspnea Age appropriate judgment and insight Nml affect and mood  Assessment and Plan  GAD (generalized anxiety disorder) - Plan: propranolol (INDERAL) 10 MG tablet  Depression, major, single episode, moderate (HCC) - Plan: buPROPion (WELLBUTRIN XL) 150 MG 24 hr tablet  Chronic pain syndrome  Encounter for screening mammogram for malignant neoplasm of breast - Plan: MM DIGITAL SCREENING BILATERAL  1/2.  Chronic, not controlled.  Add Wellbutrin 150 mg daily, continue Cymbalta  60 mg twice daily.  Add Inderal 10 mg 3 times daily as needed.  Continue with the counseling team. 3.  As above. Order placed. F/u in 1 month. The patient voiced understanding and agreement to the plan.  Shellie Dials Jansen, DO 06/10/23 11:13 AM

## 2023-06-20 ENCOUNTER — Telehealth: Payer: Self-pay

## 2023-06-20 DIAGNOSIS — F411 Generalized anxiety disorder: Secondary | ICD-10-CM

## 2023-06-20 MED ORDER — PROPRANOLOL HCL 10 MG PO TABS: ORAL_TABLET | ORAL | 0 refills | Status: DC

## 2023-06-20 NOTE — Telephone Encounter (Signed)
 New prescription sent

## 2023-06-20 NOTE — Telephone Encounter (Signed)
 Copied from CRM [id number]. Topic: Clinical - Prescription Issue >> Jun 20, 2023  2:15 PM Earnestine Goes B wrote: Reason for CRM: pt called  to advise is the provider can write her another prescription for  propranolol . Pt states she dropped the entire prescription down the sink this morning, was only able to salvage 1 tablet. Pt is asking if the provider would be able to write her another prescripton for the medication. Please call pt back at [phone]

## 2023-06-30 ENCOUNTER — Encounter: Payer: Self-pay | Admitting: Family Medicine

## 2023-06-30 ENCOUNTER — Other Ambulatory Visit: Payer: Self-pay | Admitting: Family Medicine

## 2023-06-30 DIAGNOSIS — F4323 Adjustment disorder with mixed anxiety and depressed mood: Secondary | ICD-10-CM | POA: Diagnosis not present

## 2023-06-30 MED ORDER — PROPRANOLOL HCL 40 MG PO TABS: ORAL_TABLET | ORAL | 1 refills | Status: DC

## 2023-07-14 ENCOUNTER — Encounter (HOSPITAL_BASED_OUTPATIENT_CLINIC_OR_DEPARTMENT_OTHER): Payer: Self-pay

## 2023-07-14 ENCOUNTER — Ambulatory Visit (HOSPITAL_BASED_OUTPATIENT_CLINIC_OR_DEPARTMENT_OTHER)
Admission: RE | Admit: 2023-07-14 | Discharge: 2023-07-14 | Disposition: A | Discharge status: Home / Self Care | Source: Ambulatory Visit | Attending: Family Medicine | Admitting: Family Medicine

## 2023-07-14 DIAGNOSIS — Z1231 Encounter for screening mammogram for malignant neoplasm of breast: Secondary | ICD-10-CM | POA: Diagnosis not present

## 2023-07-23 ENCOUNTER — Other Ambulatory Visit: Payer: Self-pay | Admitting: Family Medicine

## 2023-07-23 DIAGNOSIS — F411 Generalized anxiety disorder: Secondary | ICD-10-CM

## 2023-08-01 ENCOUNTER — Other Ambulatory Visit: Payer: Self-pay | Admitting: Family Medicine

## 2023-08-01 DIAGNOSIS — F321 Major depressive disorder, single episode, moderate: Secondary | ICD-10-CM

## 2023-08-01 NOTE — Telephone Encounter (Signed)
 She was due for a 1 mo f/u a few weeks go, plz sched. OK to be E visit if more convenient. Thx.

## 2023-08-12 ENCOUNTER — Ambulatory Visit: Payer: Self-pay | Admitting: *Deleted

## 2023-08-12 ENCOUNTER — Encounter: Payer: Self-pay | Admitting: Family Medicine

## 2023-08-12 ENCOUNTER — Telehealth: Admitting: Family Medicine

## 2023-08-12 DIAGNOSIS — R569 Unspecified convulsions: Secondary | ICD-10-CM | POA: Diagnosis not present

## 2023-08-12 MED ORDER — LEVETIRACETAM 500 MG PO TABS: 500.0000 mg | ORAL_TABLET | Freq: Two times a day (BID) | ORAL | 1 refills | Status: DC

## 2023-08-12 NOTE — Telephone Encounter (Signed)
 Called pt was advised needed to be seen today and video visit scheduled.

## 2023-08-12 NOTE — Telephone Encounter (Signed)
 Copied from CRM [id number]. Topic: Clinical - Red Word Triage >> Aug 12, 2023  9:26 AM Michelle Potts wrote: Red Word that prompted transfer to Nurse Triage: Seizures last one was Wednesday night Reason for Disposition  [1] Seizure lasting unknown duration AND [2] history of prior seizures AND [3] taking antiseizure medicine (anticonvulsant)  Answer Assessment - Initial Assessment Questions 1. ONSET: When did the seizure occur?     I'm having seizures.   Last one was Tues. Very late or early Wed. Morning about 2:30AM.      This is the 6-7th seizure.   I didn't know what they were before until I had a major one.   I was going to the bathroom.   I reached for the knob and my legs starting bucking and my arm too.   I was convulsing.  I got hurt and bruised up.      The other seizure I was on the toilet and fell off onto my face.   I'm getting hurt during the seizures. I was fainting and passing out but thee are actually seizures.   It'Potts hard to explain.    2. DURATION: How long did the seizure last (or how long has it been happening)? (e.g., seconds, minutes)  Note: Most seizures last less than 5 minutes.     I go completely out.  I end up on my back.   The last one on Wed am I literally fell.   I felt my body go limp.    I was bucking because I have carpet burn where I brought my fall on my knee. My leg was up in the arm shaking.   I knew what was happening but I did not have control of my leg.  I started new medication.   Wellbutrin  and Propranolol .   I was having mini seizures before starting these medications but I didn't know what it was. 3. DESCRIPTION: Describe what happened during the seizure. Did the body become stiff? Was there any jerking?  Did they lose consciousness during the seizure?     See above I'm not out for a long time   It'Potts sounds scary.   4. CIRCUMSTANCE: What was the person doing when the seizure began?      See above  I had a black eye from hitting the fireplace one  time.   I would hit the door frames.   This all started around early May 2025.   It happened 2 times then the granddaddy of them happened.   They are progressively getting worse. 5. MENTAL STATUS AFTER SEIZURE: Does the person seem more groggy or sleepy? Does the person know who they are, who you are, and where they are now?      When they are coming on, I feel like lightening is close by.   My hairs are sticking up and I feel weird.    I don't smell anything different, no visual changes before hand.   I'm getting headaches but since having the seizures I'm not getting headaches any more. 6. PRIOR SEIZURES: Has the person had a seizure (convulsion) before? (e.g., epilepsy, other cause)  If Yes, ask: When was the last time? and What happened last time?      Yes back in May this all started. 7. EPILEPSY: Does the person have epilepsy? Note: Check for medical ID bracelet.     No 8. MEDICINES: Does the person take anticonvulsant medications? (e.g., Yes, No; missed doses, any  recent changes)     Started Wellbutrin  and propranolol .   9. INJURY: Was the person hurt or injured during the seizure? (e.g., hit their head, bit their tongue)     Yes see above 10. OTHER SYMPTOMS: Are there any other symptoms? (e.g., fever, headache)       Was having headaches before the seizures but not since the seizures have started. My head, knees and my shoulders are what get injured during the seizures.    11. PREGNANCY: Is there any chance you are pregnant? When was your last menstrual period?       Not asked  Protocols used: Seizure-A-AH  Pt having seizures since May 2025 that are occurring more frequently.  No prior history of seizures.   Appt made with instructions to go to the ED if she has another seizure.   Last one was 7/23 early morning around 2:00 AM.   Feeling fine now.   Was started on Wellbutrin  and Propranolol  recently.  She mentioned she was having minor seizures prior to starting  these medications but are worse after starting them.  Message sent to Dr. Frann as RICK.      FYI Only or Action Required?: Action required by provider: clinical question for provider and update on patient condition.  Patient was last seen in primary care on 06/10/2023 by Frann Mabel Mt, DO.  Called Nurse Triage reporting Seizures. Recently started on Wellbutrin  and Propranolol  per pt.    Symptoms began several months ago. First seizure was in May 2025.  Getting worse  Interventions attempted: Nothing. She has not sought medical care or been to the ED after these seizures.    Symptoms are: gradually worsening. High priority message sent to practice.    Triage Disposition: See Physician Within 24 Hours  Patient/caregiver understands and will follow disposition?: Yes

## 2023-08-12 NOTE — Progress Notes (Signed)
 CC: Seizures  Subjective: Patient is a 53 y.o. female here for seizures. We are interacting via web portal for an electronic face-to-face visit. I verified patient's ID using 2 identifiers. Patient agreed to proceed with visit via this method. Patient is at home, I am at office. Patient and I are present for visit.   Pt reports hx of seizures a couple mo ago.  She reports no triggers and is starting to get hurt from them. She has had 6 seizures since this started and seems to getting more freq. She usually does not get an aura but did feel a strange sensation before her most recent one where her arms felt fatigued. No hx of seizures or family hx. She denies trauma preceding this. Eating/drinking normally.   Past Medical History:  Diagnosis Date   Allergy    Anemia    Anxiety    Arthritis    Colorectal cancer (HCC) 2005   Depression    IBS (irritable bowel syndrome)    Osteoporosis    Thyroid  disease     Objective: No conversational dyspnea Age appropriate judgment and insight Nml affect and mood  Assessment and Plan: Seizure-like activity (HCC) - Plan: levETIRAcetam (KEPPRA) 500 MG tablet, Ambulatory referral to Neurology  New issue w uncertain prognosis at this time. 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 Will refer to neuro for further eval. Start Keppra 500 mg bid in meanwhile. Do not drive.  The patient voiced understanding and agreement to the plan.  Mabel Mt [place], DO 08/12/23  12:10 PM

## 2023-08-15 ENCOUNTER — Ambulatory Visit: Admitting: Family Medicine

## 2023-08-18 DIAGNOSIS — F4323 Adjustment disorder with mixed anxiety and depressed mood: Secondary | ICD-10-CM | POA: Diagnosis not present

## 2023-08-23 ENCOUNTER — Other Ambulatory Visit: Payer: Self-pay | Admitting: Family Medicine

## 2023-08-28 ENCOUNTER — Other Ambulatory Visit: Payer: Self-pay | Admitting: Family Medicine

## 2023-09-18 ENCOUNTER — Other Ambulatory Visit: Payer: Self-pay | Admitting: Family Medicine

## 2023-09-18 DIAGNOSIS — F411 Generalized anxiety disorder: Secondary | ICD-10-CM

## 2023-10-04 ENCOUNTER — Other Ambulatory Visit: Payer: Self-pay | Admitting: Family Medicine

## 2023-10-04 DIAGNOSIS — R569 Unspecified convulsions: Secondary | ICD-10-CM

## 2023-10-19 IMAGING — DX DG ABDOMEN 2V
3 series · 3 of 3 positions shown · non-contrast
Comparison: CT abdomen and pelvis 04/08/2020.

CLINICAL DATA: Abdominal pain.

EXAM:
ABDOMEN - 2 VIEW

[abdomen erect]
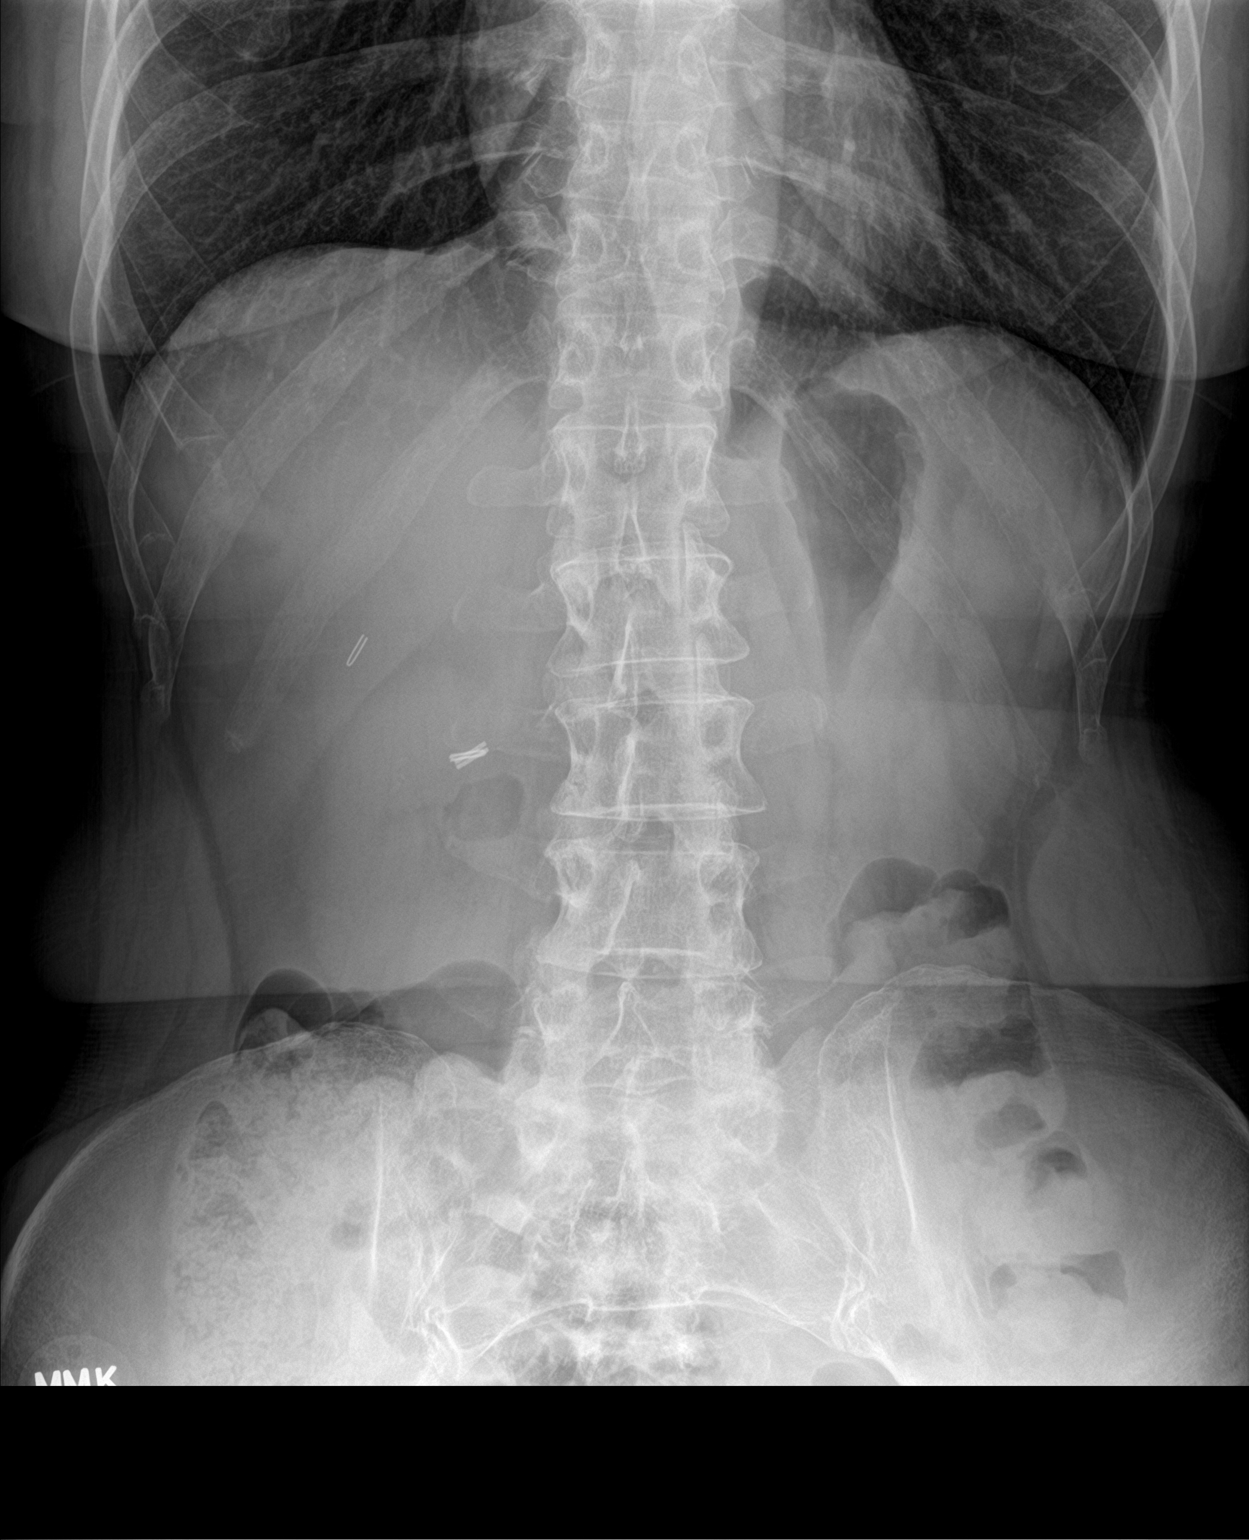

[abdomen supine (1 of 2)]
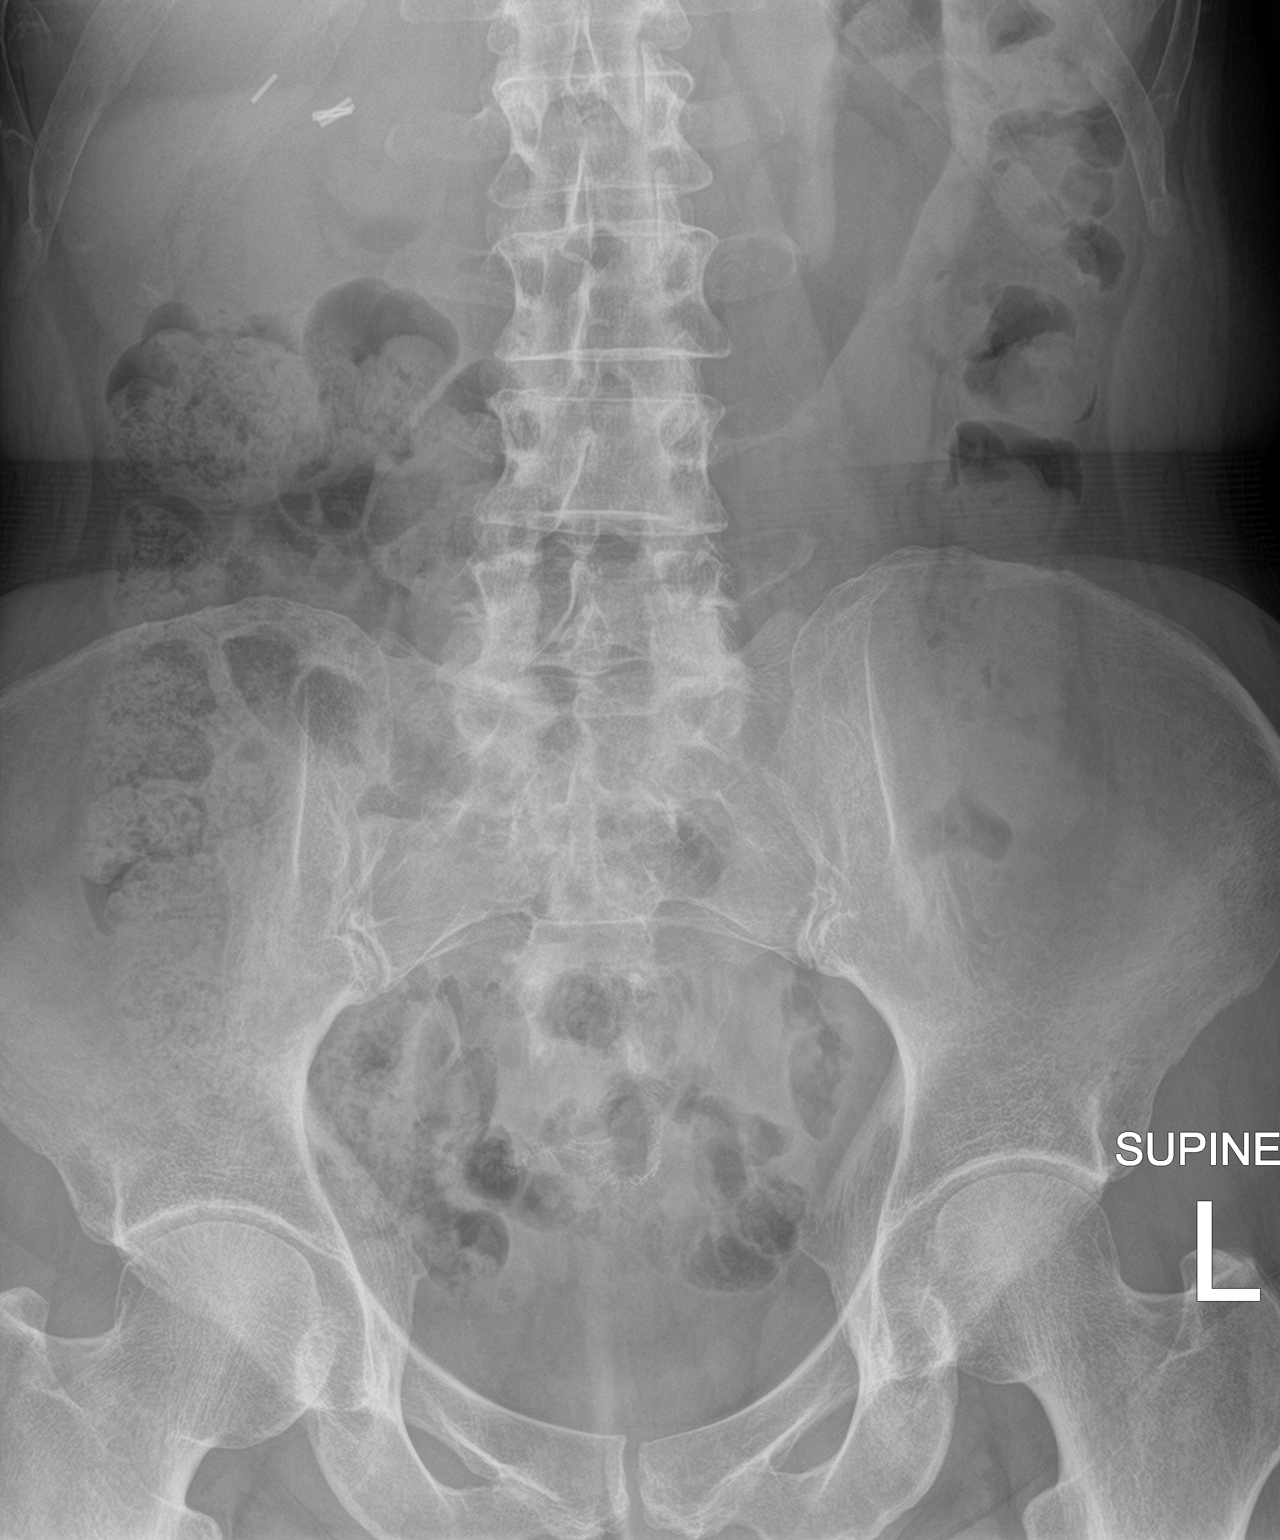

[abdomen supine (2 of 2)]
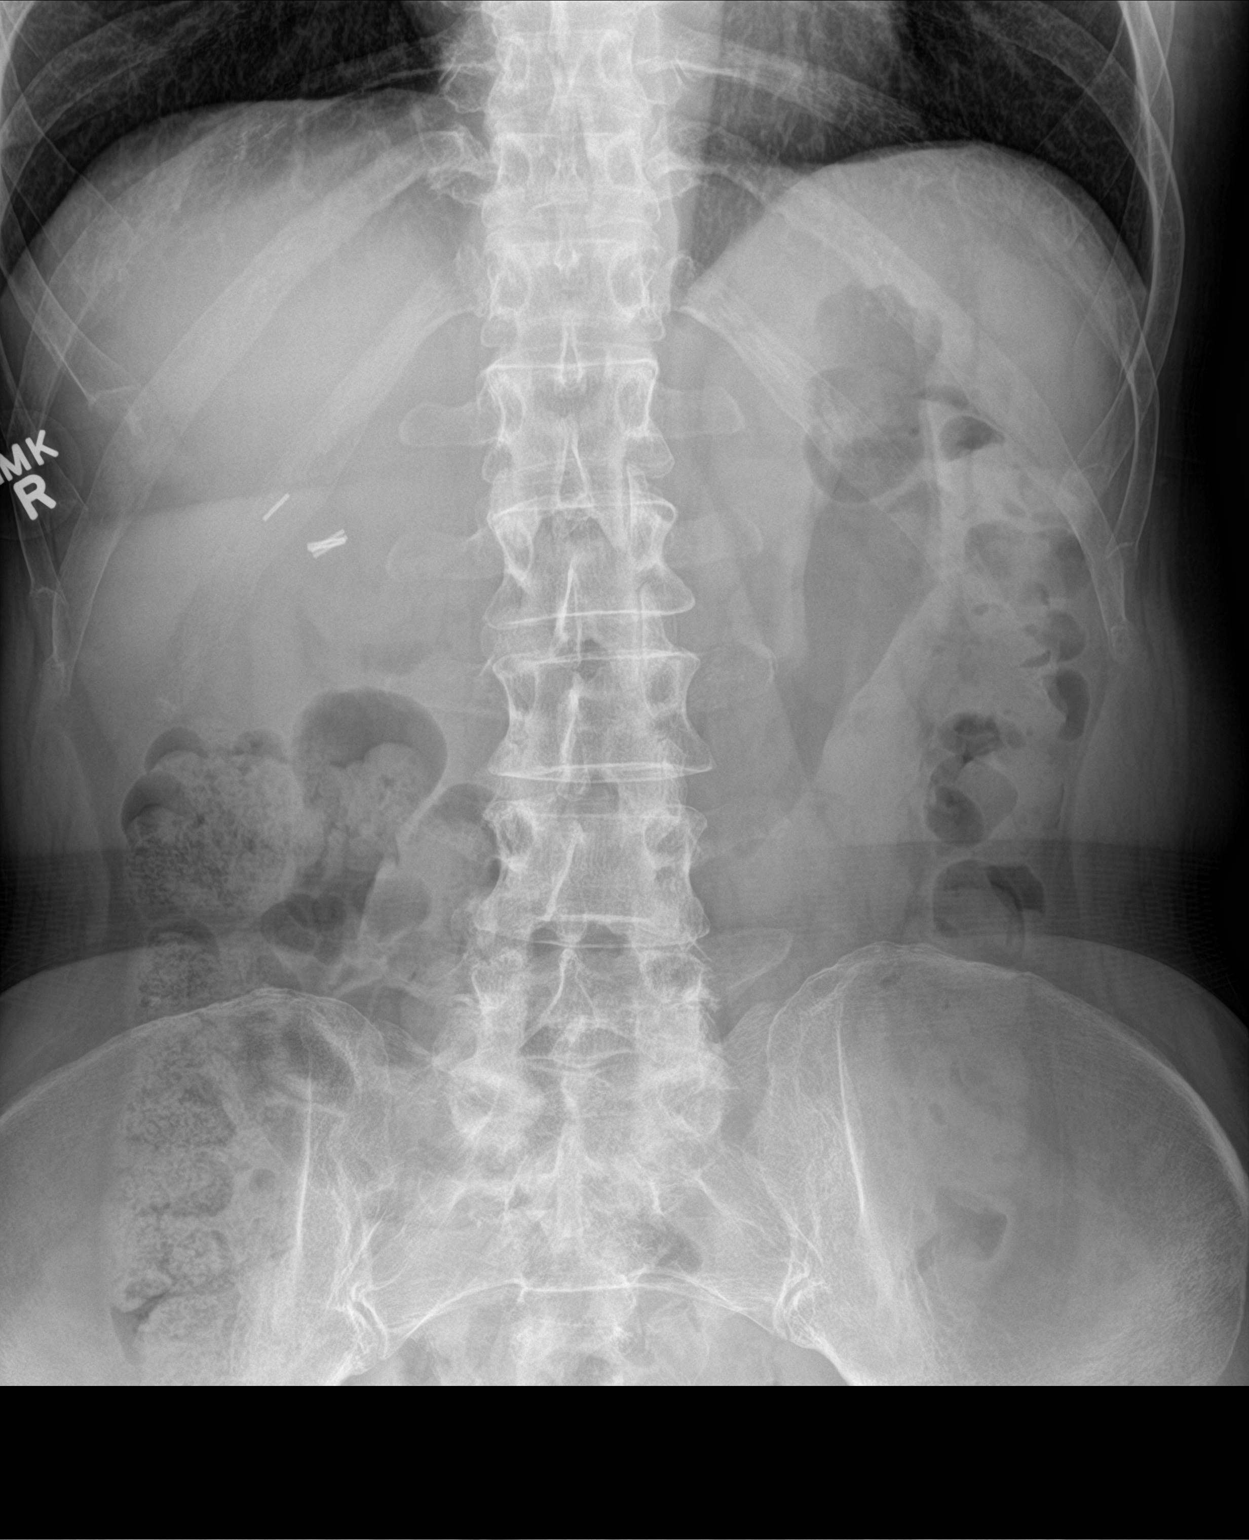

[3 of 3 positions shown; findings below may reference images not displayed]

FINDINGS: The bowel gas pattern is normal. There is no evidence of free air.
No radio-opaque calculi. Cholecystectomy clips are present. Surgical
anastomosis seen at the rectosigmoid junction, unchanged.
IMPRESSION: Negative.

## 2023-11-03 ENCOUNTER — Other Ambulatory Visit: Payer: Self-pay | Admitting: Family Medicine

## 2023-11-03 DIAGNOSIS — R569 Unspecified convulsions: Secondary | ICD-10-CM

## 2023-11-05 ENCOUNTER — Other Ambulatory Visit: Payer: Self-pay | Admitting: Family Medicine

## 2023-11-14 ENCOUNTER — Other Ambulatory Visit: Payer: Self-pay | Admitting: Family Medicine

## 2023-11-14 DIAGNOSIS — F411 Generalized anxiety disorder: Secondary | ICD-10-CM

## 2023-11-25 ENCOUNTER — Other Ambulatory Visit: Payer: Self-pay | Admitting: Family Medicine

## 2023-11-28 ENCOUNTER — Encounter: Payer: Self-pay | Admitting: Neurology

## 2023-11-28 ENCOUNTER — Ambulatory Visit: Admitting: Neurology

## 2023-11-28 VITALS — BP 110/73 | HR 56 | Ht 68.5 in | Wt 163.0 lb

## 2023-11-28 DIAGNOSIS — R569 Unspecified convulsions: Secondary | ICD-10-CM

## 2023-11-28 DIAGNOSIS — E538 Deficiency of other specified B group vitamins: Secondary | ICD-10-CM

## 2023-11-28 MED ORDER — LEVETIRACETAM 500 MG PO TABS
500.0000 mg | ORAL_TABLET | Freq: Two times a day (BID) | ORAL | 3 refills | Status: AC
Start: 2023-11-28 — End: 2024-11-22

## 2023-11-28 NOTE — Progress Notes (Signed)
 [facility]  PATIENT: Michelle Potts DOB: 1970/11/21  REQUESTING CLINICIAN: Frann Mabel Mt* HISTORY FROM: Patient REASON FOR VISIT: Seizure activity   HISTORICAL  CHIEF COMPLAINT:  Chief Complaint  Patient presents with   RM 13    Consult: seizure-like activity; started in June, wakes up on the floor; no hx of prior sz's; alone    HISTORY OF PRESENT ILLNESS:  Discussed the use of AI scribe software for clinical note transcription with the patient, who gave verbal consent to proceed.  Michelle Potts is a 53 year old female who presents with nocturnal episodes of violent jerking and falls. She was referred by Dr. Frann for evaluation of her seizure-like episodes.  Since June, she has experienced episodes characterized by violent jerking motions, primarily starting in her legs and progressing to her arms and whole body, often resulting in falls. These episodes typically occur at night when she gets up to use the restroom. She describes feeling as if she is in a 'washing machine' during these episodes. The last episode occurred at the end of September or beginning of October, resulting in bruises and a fall onto her face. No loss of consciousness during episodes.  She has been taking Keppra  since July, which she believes has reduced the frequency and severity of her episodes. Despite this, she experienced a minor episode in early October. She denies any side effects from Keppra .  She has no personal or family history of seizures prior to these episodes. She has a history of migraines, which have improved with propranolol , and thyroid  problems for which she stopped taking her medication. She also has a history of colon cancer treated with surgery in 2005 and was advised to take B12 injections due to low levels post-surgery, which she has since stopped.  She reports significant stress and depression following the loss of her husband to cancer three years ago.  She lives alone and has not had any witnesses to her episodes. She experiences disrupted sleep patterns, going to bed early but waking frequently, and takes trazodone  and melatonin to aid sleep. She reports being cold all the time and having brittle fingernails.    OTHER MEDICAL CONDITIONS: Hypertension, depression, insomnia, vitamin B12 deficiency   REVIEW OF SYSTEMS: Full 14 system review of systems performed and negative with exception of: As noted in the HPI  ALLERGIES: Allergies  Allergen Reactions   Gadobenate Nausea And Vomiting     MRI contrast media, Multihance  induced nausea and vomiting     Codeine Nausea And Vomiting   Iodinated Contrast Media Itching    Itching immediately after contrast injection. Treated with Benadryl onsite. Must be pre-medicated prior to receiving any Iodinated contrast media in the future.    HOME MEDICATIONS: Outpatient Medications Prior to Visit  Medication Sig Dispense Refill   DULoxetine  (CYMBALTA ) 60 MG capsule Take 1 capsule (60 mg total) by mouth 2 (two) times daily. Needs appt 60 capsule 0   Melatonin 5 MG CAPS Take 3 capsules by mouth at bedtime.     propranolol  (INDERAL ) 40 MG tablet TAKE 1/2 TO 1 (ONE-HALF TO ONE) TABLET BY MOUTH THREE TIMES DAILY AS NEEDED FOR ANXIETY 90 tablet 0   traZODone  (DESYREL ) 50 MG tablet Take 1-1.5 tablets (50-75 mg total) by mouth at bedtime as needed for sleep. Needs appt 45 tablet 0   levETIRAcetam  (KEPPRA ) 500 MG tablet Take 1 tablet by mouth twice daily 60 tablet 0   No facility-administered medications prior to visit.  PAST MEDICAL HISTORY: Past Medical History:  Diagnosis Date   Allergy    Anemia    Anxiety    Arthritis    Colorectal cancer (HCC) 2005   Depression    IBS (irritable bowel syndrome)    Osteoporosis    Thyroid  disease     PAST SURGICAL HISTORY: Past Surgical History:  Procedure Laterality Date   ABDOMINAL HYSTERECTOMY     APPENDECTOMY     BOWEL RESECTION     CATARACT  EXTRACTION     CHOLECYSTECTOMY     COLON SURGERY     COLONOSCOPY  06/24/2014   [facility] Everywhere   ESOPHAGOGASTRODUODENOSCOPY  05/17/2013   [facility] Everywhere   UPPER GASTROINTESTINAL ENDOSCOPY      FAMILY HISTORY: Family History  Problem Relation Age of Onset   Stroke Father    Colon cancer Neg Hx    Esophageal cancer Neg Hx    Rectal cancer Neg Hx    Stomach cancer Neg Hx     SOCIAL HISTORY: Social History   Socioeconomic History   Marital status: Widowed    Spouse name: Hosey   Number of children: 0   Years of education: Not on file   Highest education level: Some college, no degree  Occupational History   Not on file  Tobacco Use   Smoking status: Former    Types: Cigarettes   Smokeless tobacco: Never  Vaping Use   Vaping status: Former  Substance and Sexual Activity   Alcohol use: Yes    Comment: socially - rarely   Drug use: No   Sexual activity: Not on file  Other Topics Concern   Not on file  Social History Narrative   Not on file   Social Drivers of Health   Financial Resource Strain: Patient Declined (06/09/2023)   Overall Financial Resource Strain (CARDIA)    Difficulty of Paying Living Expenses: Patient declined  Food Insecurity: Food Insecurity Present (06/09/2023)   Hunger Vital Sign    Worried About Running Out of Food in the Last Year: Never true    Ran Out of Food in the Last Year: Sometimes true  Transportation Needs: No Transportation Needs (06/09/2023)   PRAPARE - Administrator, Civil Service (Medical): No    Lack of Transportation (Non-Medical): No  Physical Activity: Insufficiently Active (06/09/2023)   Exercise Vital Sign    Days of Exercise per Week: 1 day    Minutes of Exercise per Session: 10 min  Stress: Stress Concern Present (06/09/2023)   [employer] of Occupational Health - Occupational Stress Questionnaire    Feeling of Stress : Very much  Social Connections: Unknown (06/09/2023)    Social Connection and Isolation Panel    Frequency of Communication with Friends and Family: More than three times a week    Frequency of Social Gatherings with Friends and Family: Once a week    Attends Religious Services: Patient declined    Database Administrator or Organizations: No    Attends Engineer, Structural: Not on file    Marital Status: Widowed  Intimate Partner Violence: Not on file    PHYSICAL EXAM   GENERAL EXAM/CONSTITUTIONAL: Vitals:  Vitals:   11/28/23 1306  BP: 110/73  Pulse: (!) 56  Weight: 163 lb (73.9 kg)  Height: 5' 8.5 (1.74 m)   Body mass index is 24.42 kg/m. Wt Readings from Last 3 Encounters:  11/28/23 163 lb (73.9 kg)  04/07/23 165 lb (  74.8 kg)  08/19/22 171 lb (77.6 kg)   Patient is in no distress; well developed, nourished and groomed; neck is supple  MUSCULOSKELETAL: Gait, strength, tone, movements noted in Neurologic exam below  NEUROLOGIC: MENTAL STATUS:      No data to display         awake, alert, oriented to person, place and time recent and remote memory intact normal attention and concentration language fluent, comprehension intact, naming intact fund of knowledge appropriate  CRANIAL NERVE:  2nd, 3rd, 4th, 6th - Visual fields full to confrontation, extraocular muscles intact, no nystagmus 5th - facial sensation symmetric 7th - facial strength symmetric 8th - hearing intact 9th - palate elevates symmetrically, uvula midline 11th - shoulder shrug symmetric 12th - tongue protrusion midline  MOTOR:  normal bulk and tone, full strength in the BUE, BLE  SENSORY:  normal and symmetric to light touch  COORDINATION:  finger-nose-finger, fine finger movements normal  GAIT/STATION:  normal   DIAGNOSTIC DATA (LABS, IMAGING, TESTING) - I reviewed patient records, labs, notes, testing and imaging myself where available.  Lab Results  Component Value Date   WBC 5.4 05/12/2022   HGB 12.5 05/12/2022   HCT  37.1 05/12/2022   MCV 91.3 05/12/2022   PLT 172.0 05/12/2022      Component Value Date/Time   NA 138 05/12/2022 1453   K 3.9 05/12/2022 1453   CL 99 05/12/2022 1453   CO2 32 05/12/2022 1453   GLUCOSE 87 05/12/2022 1453   BUN 11 05/12/2022 1453   CREATININE 1.21 (H) 05/12/2022 1453   CALCIUM  9.2 05/12/2022 1453   PROT 7.3 05/12/2022 1453   ALBUMIN 4.3 05/12/2022 1453   AST 12 05/12/2022 1453   ALT 8 05/12/2022 1453   ALKPHOS 47 05/12/2022 1453   BILITOT 0.3 05/12/2022 1453   Lab Results  Component Value Date   CHOL 235 (H) 11/09/2021   HDL 58.90 11/09/2021   LDLCALC 158 (H) 11/09/2021   TRIG 92.0 11/09/2021   CHOLHDL 4 11/09/2021   No results found for: HGBA1C Lab Results  Component Value Date   VITAMINB12 157 (L) 05/12/2022   Lab Results  Component Value Date   TSH 2.22 05/12/2022   MRI Brain 2020 1. No acute intracranial abnormality or mass.  2. Mild cerebral white matter T2 signal changes, nonspecific but may reflect early chronic small vessel ischemia, migraines, or prior infection/inflammation    ASSESSMENT AND PLAN  53 y.o. 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 Episodes involve legs, arms, and whole body, with no loss of consciousness. No prior history of seizures or family history. Episodes have decreased in frequency since starting Keppra  in July. Differential includes myoclonic seizures, but diagnosis remains seizure-like activity pending EEG results. Lack of sleep may be a trigger. - Continue Keppra  as prescribed. - Ordered EEG to assess for seizure activity. - Advised to increase trazodone  to 100 mg if sleep difficulties persist. - Instructed to take melatonin at least two hours before bedtime. - Advised to contact Doctor Frann to restart B12 treatment. - Advised to report any further episodes immediately.  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 Previously managed with B12 injections, but treatment was stopped due to perceived lack of absorption. Recent labs indicate low B12 levels. - Contact Doctor Wendling to restart B12 injections.  1. Seizure-like activity (HCC)   2. Vitamin B 12 deficiency      Patient Instructions  Routine EEG, I will contact you to go over the results Please contact Dr. Frann regarding B12 deficiency.  You should restart B12 injection Continue your other medications Return if worse  Orders Placed This Encounter  Procedures   EEG adult    Meds ordered this encounter  Medications   levETIRAcetam  (KEPPRA ) 500 MG tablet    Sig: Take 1 tablet (500 mg total) by mouth 2 (two) times daily.    Dispense:  180 tablet    Refill:  3    Return in about 1 year (around 11/27/2024).    Pastor Falling, MD 11/28/2023, 2:45 PM  [facility] [address] [place], [place] [address] [phone]

## 2023-11-28 NOTE — Patient Instructions (Signed)
 Routine EEG, I will contact you to go over the results Please contact Dr. Frann regarding B12 deficiency.  You should restart B12 injection Continue your other medications Return if worse

## 2023-11-29 ENCOUNTER — Telehealth: Payer: Self-pay | Admitting: Neurology

## 2023-11-29 IMAGING — DX DG CHEST 2V
2 series · 2 of 2 positions shown · non-contrast
Comparison: March 19, 2020

CLINICAL DATA: Chronic cough.

EXAM:
CHEST - 2 VIEW

[chest pa]
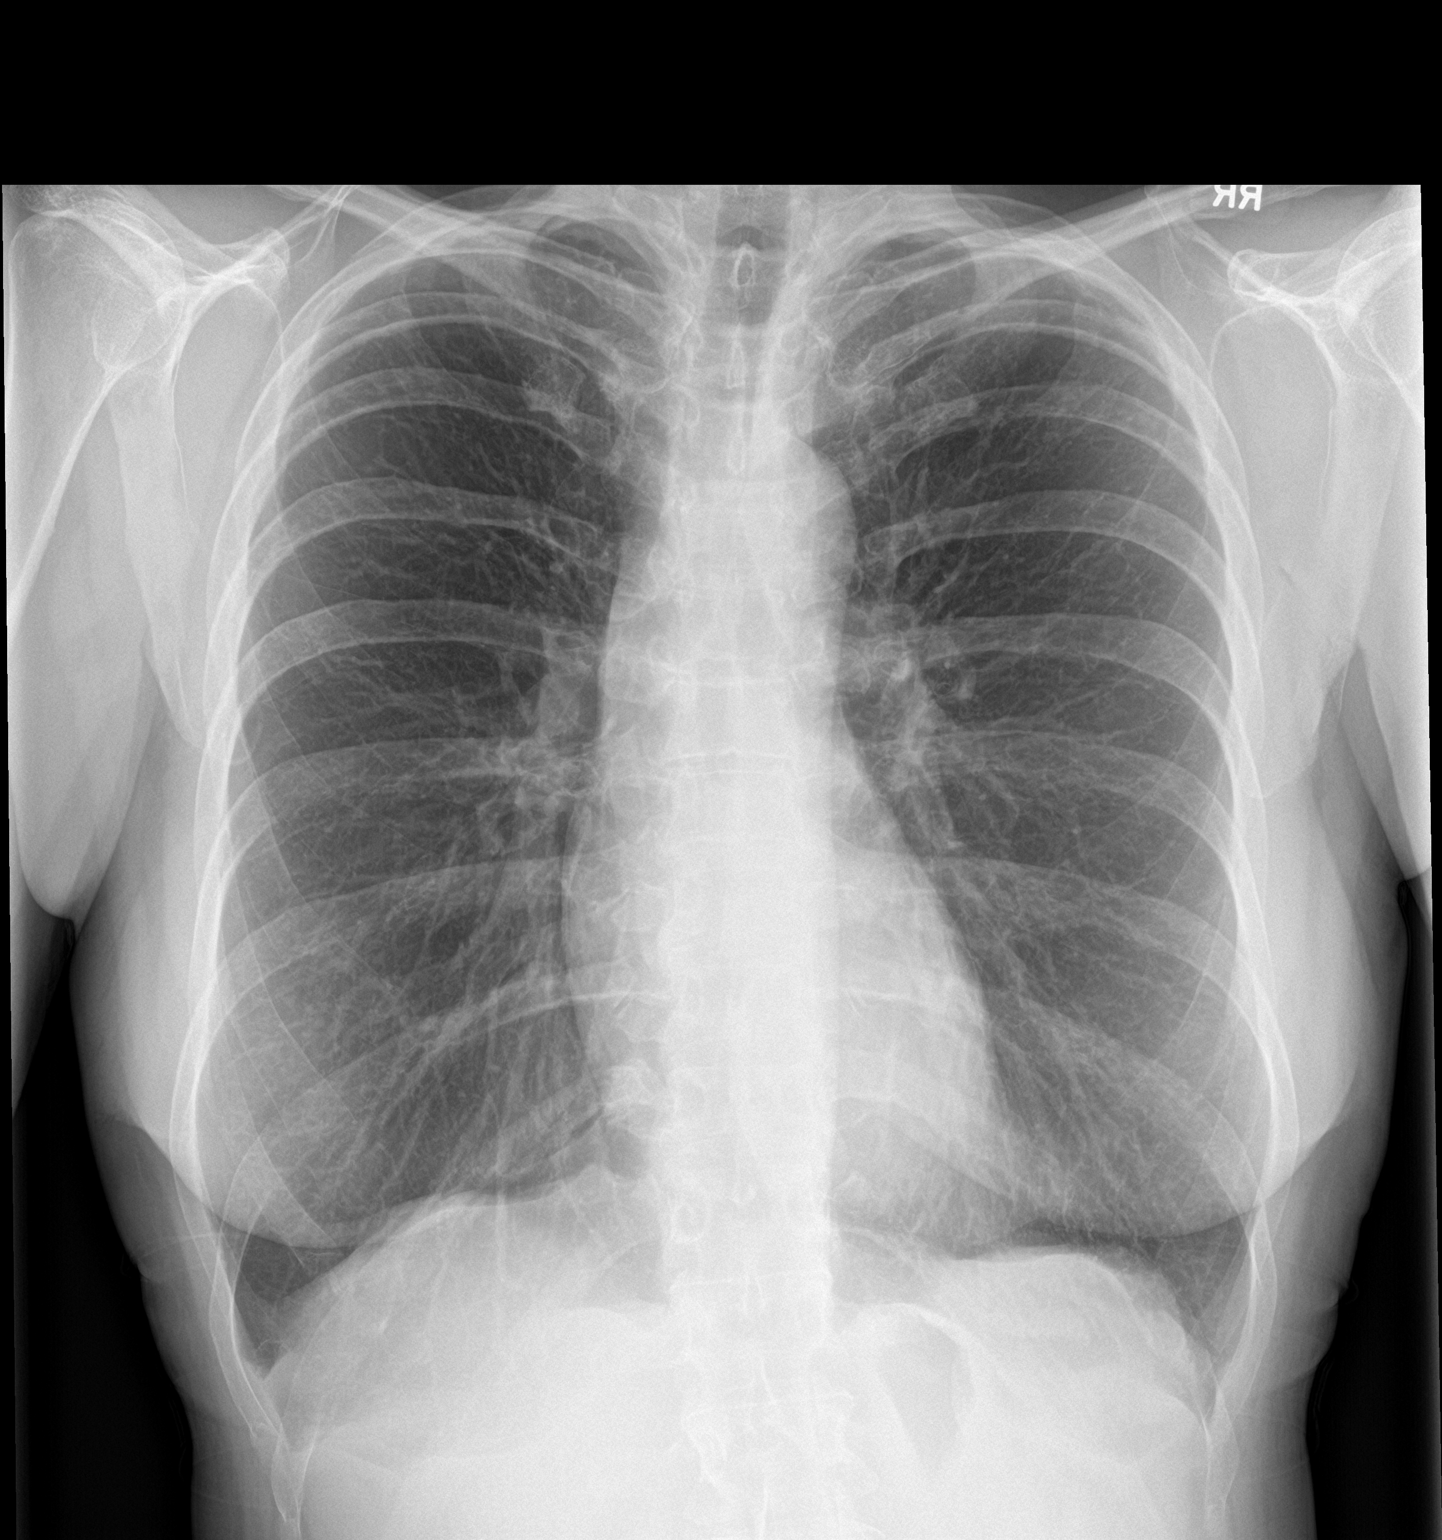

[chest lat]
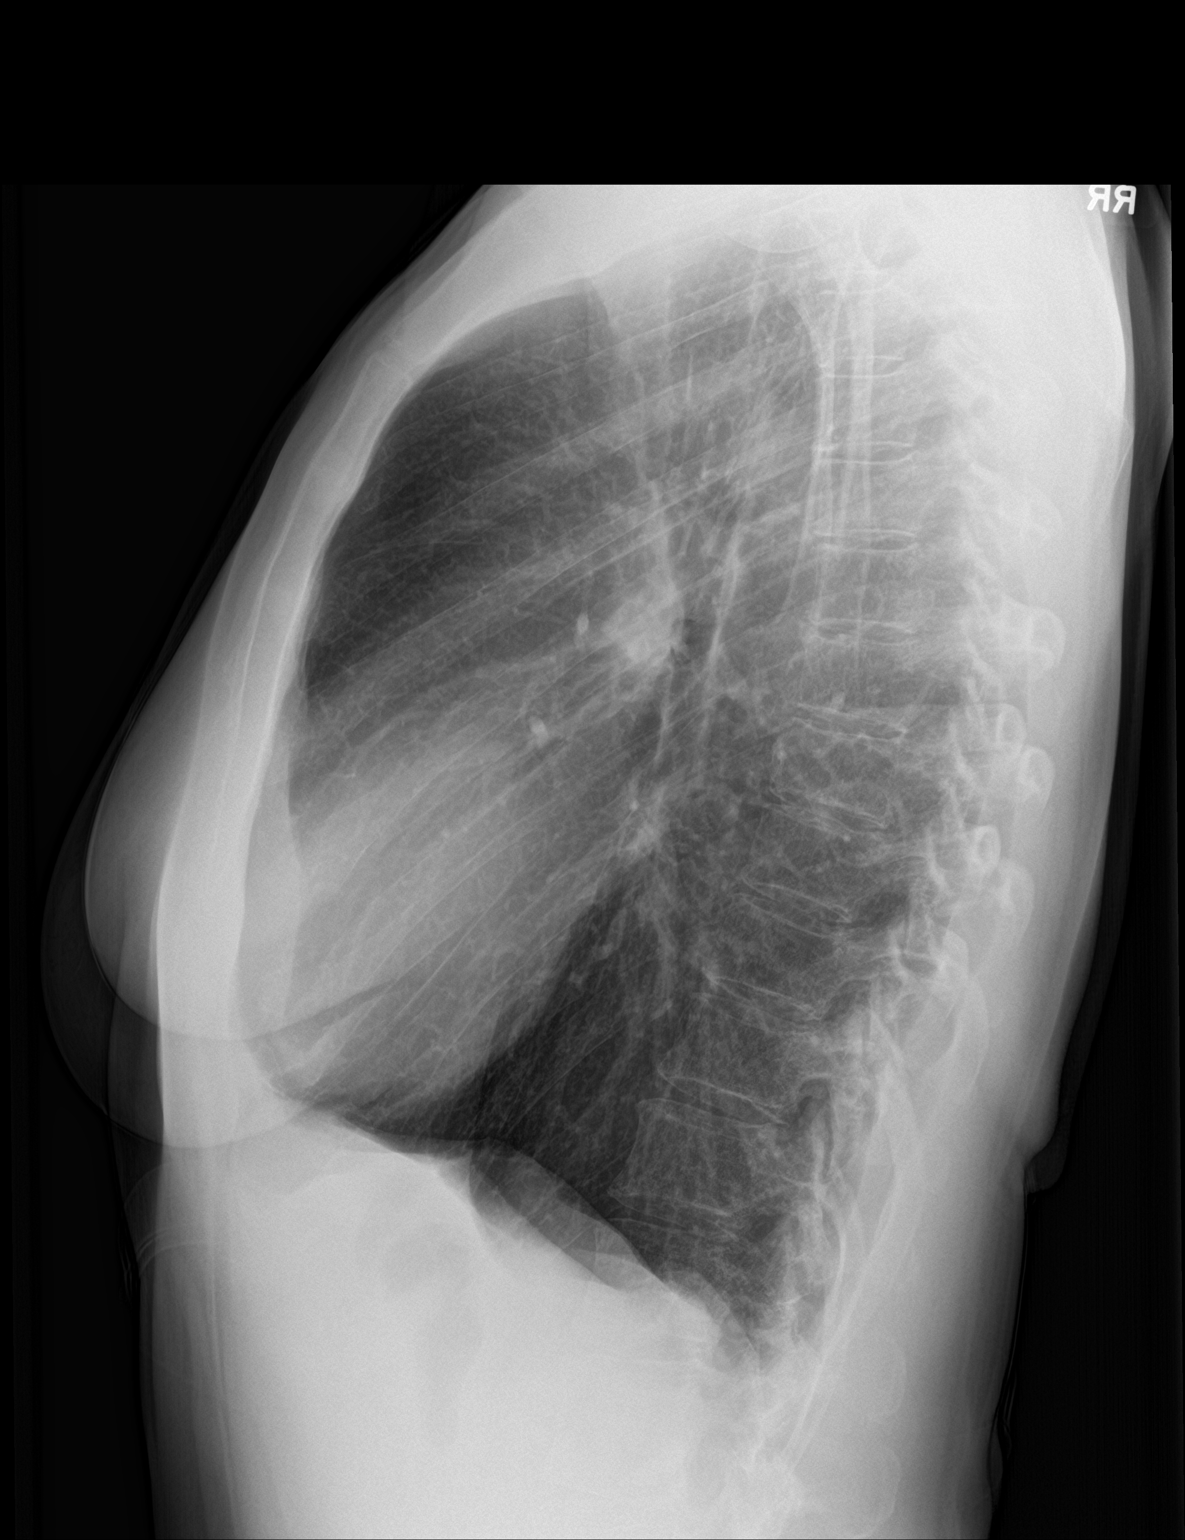

[2 of 2 positions shown; findings below may reference images not displayed]

FINDINGS: The heart size and mediastinal contours are within normal limits.
Both lungs are clear. The visualized skeletal structures are
unremarkable.
IMPRESSION: No active cardiopulmonary disease.

## 2023-11-29 NOTE — Telephone Encounter (Signed)
 Pt cx her EEG, will call back to r/s

## 2023-12-01 ENCOUNTER — Other Ambulatory Visit: Admitting: *Deleted

## 2023-12-09 ENCOUNTER — Ambulatory Visit (HOSPITAL_BASED_OUTPATIENT_CLINIC_OR_DEPARTMENT_OTHER)
Admission: RE | Admit: 2023-12-09 | Discharge: 2023-12-09 | Disposition: A | Discharge status: Home / Self Care | Source: Ambulatory Visit | Attending: Family Medicine | Admitting: Family Medicine

## 2023-12-09 ENCOUNTER — Encounter: Payer: Self-pay | Admitting: Family Medicine

## 2023-12-09 ENCOUNTER — Ambulatory Visit: Admitting: Family Medicine

## 2023-12-09 VITALS — BP 116/70 | HR 93 | Temp 98.0°F | Resp 16 | Ht 68.0 in | Wt 164.4 lb

## 2023-12-09 DIAGNOSIS — M25551 Pain in right hip: Secondary | ICD-10-CM | POA: Diagnosis not present

## 2023-12-09 DIAGNOSIS — R053 Chronic cough: Secondary | ICD-10-CM | POA: Diagnosis not present

## 2023-12-09 MED ORDER — IPRATROPIUM-ALBUTEROL 0.5-2.5 (3) MG/3ML IN SOLN
3.0000 mL | Freq: Once | RESPIRATORY_TRACT | Status: AC
  Administered 2023-12-09: 3 mL via RESPIRATORY_TRACT

## 2023-12-09 MED ORDER — UMECLIDINIUM BROMIDE 62.5 MCG/ACT IN AEPB: 1.0000 | INHALATION_SPRAY | Freq: Every day | RESPIRATORY_TRACT | 0 refills | Status: AC

## 2023-12-09 NOTE — Progress Notes (Signed)
 Chief Complaint  Patient presents with   Cough    Cough    Michelle Potts is 53 y.o. and is here for a cough.  Duration: 4 months Productive? Yes- frothy/great Associated symptoms: tickle in throat Denies: fever, night sweats, nasal congestion, rhinorrhea, sore throat, facial pain, hemoptysis, dyspnea, wheezing Hx of GERD? No but has been having some indigestion ACEi? No She is a former smoker.   Past Medical History:  Diagnosis Date   Allergy    Anemia    Anxiety    Arthritis    Colorectal cancer (HCC) 2005   Depression    IBS (irritable bowel syndrome)    Osteoporosis    Thyroid  disease    Family History  Problem Relation Age of Onset   Stroke Father    Colon cancer Neg Hx    Esophageal cancer Neg Hx    Rectal cancer Neg Hx    Stomach cancer Neg Hx    Allergies as of 12/09/2023       Reactions   Gadobenate Nausea And Vomiting    MRI contrast media, Multihance  induced nausea and vomiting     Codeine Nausea And Vomiting   Iodinated Contrast Media Itching   Itching immediately after contrast injection. Treated with Benadryl onsite. Must be pre-medicated prior to receiving any Iodinated contrast media in the future.        Medication List        Accurate as of December 09, 2023  4:33 PM. If you have any questions, ask your nurse or doctor.          DULoxetine  60 MG capsule Commonly known as: CYMBALTA  Take 1 capsule (60 mg total) by mouth 2 (two) times daily. Needs appt   levETIRAcetam  500 MG tablet Commonly known as: KEPPRA  Take 1 tablet (500 mg total) by mouth 2 (two) times daily.   Melatonin 5 MG Caps Take 3 capsules by mouth at bedtime.   propranolol  40 MG tablet Commonly known as: INDERAL  TAKE 1/2 TO 1 (ONE-HALF TO ONE) TABLET BY MOUTH THREE TIMES DAILY AS NEEDED FOR ANXIETY   traZODone  50 MG tablet Commonly known as: DESYREL  Take 1-1.5 tablets (50-75 mg total) by mouth at bedtime as needed for sleep. Needs appt   umeclidinium bromide  62.5  MCG/ACT Aepb Commonly known as: INCRUSE ELLIPTA  Inhale 1 puff into the lungs daily. Started by: Michelle Potts        BP 116/70 (BP Location: Left Arm, Patient Position: Sitting)   Pulse 93   Temp 98 F (36.7 C) (Oral)   Resp 16   Ht 5' 8 (1.727 m)   Wt 164 lb 6.4 oz (74.6 kg)   SpO2 95%   BMI 25.00 kg/m  Gen: Awake, alert, appears stated age HEENT: Ears neg, nares patent without D/C, turbinates unremarkable, Pharynx pink without exudate Neck: Supple, no masses or asymmetry, no tenderness Heart: RRR, no LE edema Lungs: CTAB, normal effort, no accessory muscle use MSK: TTP in R lumbar region, poor hamstring range of motion bilaterally; negative Stinchfield, straight leg, logroll Psych: Age appropriate judgement and insight, normal mood and affect  Chronic cough - Plan: DG Chest 2 View, ipratropium-albuterol  (DUONEB) 0.5-2.5 (3) MG/3ML nebulizer solution 3 mL, umeclidinium bromide  (INCRUSE ELLIPTA ) 62.5 MCG/ACT AEPB  Right hip pain  Chronic, not controlled.  Check a chest X-ray.  DuoNeb treatment was helpful.  Will send in LAMA. F/u in 4 weeks if symptoms fail to improve. Stretches and exercises for the low back provided.  Heat, ice, Tylenol . The patient voiced understanding and agreement to the plan.  Michelle Potts, [place] 12/09/23 4:33 PM

## 2023-12-09 NOTE — Patient Instructions (Addendum)
 I do recommend you undergo the EEG.   Consider taking Allegra daily to see if it helps with motion sickness.   We will be in touch with your X-ray.   Heat (pad or rice pillow in microwave) over affected area, 10-15 minutes twice daily.   Ice/cold pack over area for 10-15 min twice daily.  OK to take Tylenol  1000 mg (2 extra strength tabs) or 975 mg (3 regular strength tabs) every 6 hours as needed.  Let us  know if you need anything.  EXERCISES  RANGE OF MOTION (ROM) AND STRETCHING EXERCISES - Low Back Pain Most people with lower back pain will find that their symptoms get worse with excessive bending forward (flexion) or arching at the lower back (extension). The exercises that will help resolve your symptoms will focus on the opposite motion.  If you have pain, numbness or tingling which travels down into your buttocks, leg or foot, the goal of the therapy is for these symptoms to move closer to your back and eventually resolve. Sometimes, these leg symptoms will get better, but your lower back pain may worsen. This is often an indication of progress in your rehabilitation. Be very alert to any changes in your symptoms and the activities in which you participated in the 24 hours prior to the change. Sharing this information with your caregiver will allow him or her to most efficiently treat your condition. These exercises may help you when beginning to rehabilitate your injury. Your symptoms may resolve with or without further involvement from your physician, physical therapist or athletic trainer. While completing these exercises, remember:  Restoring tissue flexibility helps normal motion to return to the joints. This allows healthier, less painful movement and activity. An effective stretch should be held for at least 30 seconds. A stretch should never be painful. You should only feel a gentle lengthening or release in the stretched tissue. FLEXION RANGE OF MOTION AND STRETCHING  EXERCISES:  STRETCH - Flexion, Single Knee to Chest  Lie on a firm bed or floor with both legs extended in front of you. Keeping one leg in contact with the floor, bring your opposite knee to your chest. Hold your leg in place by either grabbing behind your thigh or at your knee. Pull until you feel a gentle stretch in your low back. Hold 30 seconds. Slowly release your grasp and repeat the exercise with the opposite side. Repeat 2 times. Complete this exercise 3 times per week.   STRETCH - Flexion, Double Knee to Chest Lie on a firm bed or floor with both legs extended in front of you. Keeping one leg in contact with the floor, bring your opposite knee to your chest. Tense your stomach muscles to support your back and then lift your other knee to your chest. Hold your legs in place by either grabbing behind your thighs or at your knees. Pull both knees toward your chest until you feel a gentle stretch in your low back. Hold 30 seconds. Tense your stomach muscles and slowly return one leg at a time to the floor. Repeat 2 times. Complete this exercise 3 times per week.   STRETCH - Low Trunk Rotation Lie on a firm bed or floor. Keeping your legs in front of you, bend your knees so they are both pointed toward the ceiling and your feet are flat on the floor. Extend your arms out to the side. This will stabilize your upper body by keeping your shoulders in contact with the floor.  Gently and slowly drop both knees together to one side until you feel a gentle stretch in your low back. Hold for 30 seconds. Tense your stomach muscles to support your lower back as you bring your knees back to the starting position. Repeat the exercise to the other side. Repeat 2 times. Complete this exercise at least 3 times per week.   EXTENSION RANGE OF MOTION AND FLEXIBILITY EXERCISES:  STRETCH - Extension, Prone on Elbows  Lie on your stomach on the floor, a bed will be too soft. Place your palms about shoulder  width apart and at the height of your head. Place your elbows under your shoulders. If this is too painful, stack pillows under your chest. Allow your body to relax so that your hips drop lower and make contact more completely with the floor. Hold this position for 30 seconds. Slowly return to lying flat on the floor. Repeat 2 times. Complete this exercise 3 times per week.   RANGE OF MOTION - Extension, Prone Press Ups Lie on your stomach on the floor, a bed will be too soft. Place your palms about shoulder width apart and at the height of your head. Keeping your back as relaxed as possible, slowly straighten your elbows while keeping your hips on the floor. You may adjust the placement of your hands to maximize your comfort. As you gain motion, your hands will come more underneath your shoulders. Hold this position 30 seconds. Slowly return to lying flat on the floor. Repeat 2 times. Complete this exercise 3 times per week.   RANGE OF MOTION- Quadruped, Neutral Spine  Assume a hands and knees position on a firm surface. Keep your hands under your shoulders and your knees under your hips. You may place padding under your knees for comfort. Drop your head and point your tailbone toward the ground below you. This will round out your lower back like an angry cat. Hold this position for 30 seconds. Slowly lift your head and release your tail bone so that your back sags into a large arch, like an old horse. Hold this position for 30 seconds. Repeat this until you feel limber in your low back. Now, find your sweet spot. This will be the most comfortable position somewhere between the two previous positions. This is your neutral spine. Once you have found this position, tense your stomach muscles to support your low back. Hold this position for 30 seconds. Repeat 2 times. Complete this exercise 3 times per week.   STRENGTHENING EXERCISES - Low Back Sprain These exercises may help you when  beginning to rehabilitate your injury. These exercises should be done near your sweet spot. This is the neutral, low-back arch, somewhere between fully rounded and fully arched, that is your least painful position. When performed in this safe range of motion, these exercises can be used for people who have either a flexion or extension based injury. These exercises may resolve your symptoms with or without further involvement from your physician, physical therapist or athletic trainer. While completing these exercises, remember:  Muscles can gain both the endurance and the strength needed for everyday activities through controlled exercises. Complete these exercises as instructed by your physician, physical therapist or athletic trainer. Increase the resistance and repetitions only as guided. You may experience muscle soreness or fatigue, but the pain or discomfort you are trying to eliminate should never worsen during these exercises. If this pain does worsen, stop and make certain you are following the directions  exactly. If the pain is still present after adjustments, discontinue the exercise until you can discuss the trouble with your caregiver.  STRENGTHENING - Deep Abdominals, Pelvic Tilt  Lie on a firm bed or floor. Keeping your legs in front of you, bend your knees so they are both pointed toward the ceiling and your feet are flat on the floor. Tense your lower abdominal muscles to press your low back into the floor. This motion will rotate your pelvis so that your tail bone is scooping upwards rather than pointing at your feet or into the floor. With a gentle tension and even breathing, hold this position for 3 seconds. Repeat 2 times. Complete this exercise 3 times per week.   STRENGTHENING - Abdominals, Crunches  Lie on a firm bed or floor. Keeping your legs in front of you, bend your knees so they are both pointed toward the ceiling and your feet are flat on the floor. Cross your arms over  your chest. Slightly tip your chin down without bending your neck. Tense your abdominals and slowly lift your trunk high enough to just clear your shoulder blades. Lifting higher can put excessive stress on the lower back and does not further strengthen your abdominal muscles. Control your return to the starting position. Repeat 2 times. Complete this exercise 3 times per week.   STRENGTHENING - Quadruped, Opposite UE/LE Lift  Assume a hands and knees position on a firm surface. Keep your hands under your shoulders and your knees under your hips. You may place padding under your knees for comfort. Find your neutral spine and gently tense your abdominal muscles so that you can maintain this position. Your shoulders and hips should form a rectangle that is parallel with the floor and is not twisted. Keeping your trunk steady, lift your right hand no higher than your shoulder and then your left leg no higher than your hip. Make sure you are not holding your breath. Hold this position for 30 seconds. Continuing to keep your abdominal muscles tense and your back steady, slowly return to your starting position. Repeat with the opposite arm and leg. Repeat 2 times. Complete this exercise 3 times per week.   STRENGTHENING - Abdominals and Quadriceps, Straight Leg Raise  Lie on a firm bed or floor with both legs extended in front of you. Keeping one leg in contact with the floor, bend the other knee so that your foot can rest flat on the floor. Find your neutral spine, and tense your abdominal muscles to maintain your spinal position throughout the exercise. Slowly lift your straight leg off the floor about 6 inches for a count of 3, making sure to not hold your breath. Still keeping your neutral spine, slowly lower your leg all the way to the floor. Repeat this exercise with each leg 2 times. Complete this exercise 3 times per week.  POSTURE AND BODY MECHANICS CONSIDERATIONS - Low Back Sprain Keeping  correct posture when sitting, standing or completing your activities will reduce the stress put on different body tissues, allowing injured tissues a chance to heal and limiting painful experiences. The following are general guidelines for improved posture.  While reading these guidelines, remember: The exercises prescribed by your provider will help you have the flexibility and strength to maintain correct postures. The correct posture provides the best environment for your joints to work. All of your joints have less wear and tear when properly supported by a spine with good posture. This means you will  experience a healthier, less painful body. Correct posture must be practiced with all of your activities, especially prolonged sitting and standing. Correct posture is as important when doing repetitive low-stress activities (typing) as it is when doing a single heavy-load activity (lifting).  RESTING POSITIONS Consider which positions are most painful for you when choosing a resting position. If you have pain with flexion-based activities (sitting, bending, stooping, squatting), choose a position that allows you to rest in a less flexed posture. You would want to avoid curling into a fetal position on your side. If your pain worsens with extension-based activities (prolonged standing, working overhead), avoid resting in an extended position such as sleeping on your stomach. Most people will find more comfort when they rest with their spine in a more neutral position, neither too rounded nor too arched. Lying on a non-sagging bed on your side with a pillow between your knees, or on your back with a pillow under your knees will often provide some relief. Keep in mind, being in any one position for a prolonged period of time, no matter how correct your posture, can still lead to stiffness.  PROPER SITTING POSTURE In order to minimize stress and discomfort on your spine, you must sit with correct posture.  Sitting with good posture should be effortless for a healthy body. Returning to good posture is a gradual process. Many people can work toward this most comfortably by using various supports until they have the flexibility and strength to maintain this posture on their own. When sitting with proper posture, your ears will fall over your shoulders and your shoulders will fall over your hips. You should use the back of the chair to support your upper back. Your lower back will be in a neutral position, just slightly arched. You may place a small pillow or folded towel at the base of your lower back for  support.  When working at a desk, create an environment that supports good, upright posture. Without extra support, muscles tire, which leads to excessive strain on joints and other tissues. Keep these recommendations in mind:  CHAIR: A chair should be able to slide under your desk when your back makes contact with the back of the chair. This allows you to work closely. The chair's height should allow your eyes to be level with the upper part of your monitor and your hands to be slightly lower than your elbows.  BODY POSITION Your feet should make contact with the floor. If this is not possible, use a foot rest. Keep your ears over your shoulders. This will reduce stress on your neck and low back.  INCORRECT SITTING POSTURES  If you are feeling tired and unable to assume a healthy sitting posture, do not slouch or slump. This puts excessive strain on your back tissues, causing more damage and pain. Healthier options include: Using more support, like a lumbar pillow. Switching tasks to something that requires you to be upright or walking. Talking a brief walk. Lying down to rest in a neutral-spine position.  PROLONGED STANDING WHILE SLIGHTLY LEANING FORWARD  When completing a task that requires you to lean forward while standing in one place for a long time, place either foot up on a stationary 2-4  inch high object to help maintain the best posture. When both feet are on the ground, the lower back tends to lose its slight inward curve. If this curve flattens (or becomes too large), then the back and your other joints will experience  too much stress, tire more quickly, and can cause pain.  CORRECT STANDING POSTURES Proper standing posture should be assumed with all daily activities, even if they only take a few moments, like when brushing your teeth. As in sitting, your ears should fall over your shoulders and your shoulders should fall over your hips. You should keep a slight tension in your abdominal muscles to brace your spine. Your tailbone should point down to the ground, not behind your body, resulting in an over-extended swayback posture.   INCORRECT STANDING POSTURES  Common incorrect standing postures include a forward head, locked knees and/or an excessive swayback. WALKING Walk with an upright posture. Your ears, shoulders and hips should all line-up.  PROLONGED ACTIVITY IN A FLEXED POSITION When completing a task that requires you to bend forward at your waist or lean over a low surface, try to find a way to stabilize 3 out of 4 of your limbs. You can place a hand or elbow on your thigh or rest a knee on the surface you are reaching across. This will provide you more stability, so that your muscles do not tire as quickly. By keeping your knees relaxed, or slightly bent, you will also reduce stress across your lower back. CORRECT LIFTING TECHNIQUES  DO : Assume a wide stance. This will provide you more stability and the opportunity to get as close as possible to the object which you are lifting. Tense your abdominals to brace your spine. Bend at the knees and hips. Keeping your back locked in a neutral-spine position, lift using your leg muscles. Lift with your legs, keeping your back straight. Test the weight of unknown objects before attempting to lift them. Try to keep your  elbows locked down at your sides in order get the best strength from your shoulders when carrying an object.   Always ask for help when lifting heavy or awkward objects. INCORRECT LIFTING TECHNIQUES DO NOT:  Lock your knees when lifting, even if it is a small object. Bend and twist. Pivot at your feet or move your feet when needing to change directions. Assume that you can safely pick up even a paperclip without proper posture.

## 2023-12-15 ENCOUNTER — Ambulatory Visit: Payer: Self-pay | Admitting: Family Medicine

## 2023-12-21 ENCOUNTER — Other Ambulatory Visit: Payer: Self-pay | Admitting: Family Medicine

## 2023-12-21 DIAGNOSIS — F411 Generalized anxiety disorder: Secondary | ICD-10-CM

## 2024-02-21 ENCOUNTER — Other Ambulatory Visit: Payer: Self-pay | Admitting: Family Medicine

## 2024-02-21 DIAGNOSIS — F411 Generalized anxiety disorder: Secondary | ICD-10-CM

## 2024-02-23 ENCOUNTER — Other Ambulatory Visit: Payer: Self-pay | Admitting: Family Medicine

## 2024-11-27 ENCOUNTER — Ambulatory Visit: Admitting: Neurology
